# Patient Record
Sex: Female | Born: 1980 | Race: Black or African American | Hispanic: No | Marital: Married | Smoking: Never smoker
Health system: Southern US, Community
[De-identification: ages and names within clinical notes are randomized; demographics above are authoritative.]

## PROBLEM LIST (undated history)

## (undated) DIAGNOSIS — A6000 Herpesviral infection of urogenital system, unspecified: Secondary | ICD-10-CM

## (undated) DIAGNOSIS — D219 Benign neoplasm of connective and other soft tissue, unspecified: Secondary | ICD-10-CM

## (undated) DIAGNOSIS — O1495 Unspecified pre-eclampsia, complicating the puerperium: Secondary | ICD-10-CM

## (undated) DIAGNOSIS — O24419 Gestational diabetes mellitus in pregnancy, unspecified control: Secondary | ICD-10-CM

## (undated) HISTORY — DX: Gestational diabetes mellitus in pregnancy, unspecified control: O24.419

## (undated) HISTORY — DX: Herpesviral infection of urogenital system, unspecified: A60.00

## (undated) HISTORY — DX: Unspecified pre-eclampsia, complicating the puerperium: O14.95

## (undated) HISTORY — PX: NO PAST SURGERIES: SHX2092

## (undated) HISTORY — PX: MOUTH SURGERY: SHX715

## (undated) HISTORY — DX: Benign neoplasm of connective and other soft tissue, unspecified: D21.9

---

## 2009-04-20 ENCOUNTER — Encounter: Payer: Self-pay | Admitting: Infectious Diseases

## 2009-07-08 DIAGNOSIS — N764 Abscess of vulva: Secondary | ICD-10-CM

## 2009-07-14 ENCOUNTER — Encounter (INDEPENDENT_AMBULATORY_CARE_PROVIDER_SITE_OTHER): Payer: Self-pay | Admitting: *Deleted

## 2009-08-01 ENCOUNTER — Ambulatory Visit: Payer: Self-pay | Admitting: Infectious Diseases

## 2010-03-21 NOTE — Miscellaneous (Signed)
Summary: HIPAA Restrictions  HIPAA Restrictions   Imported By: Florinda Marker 08/03/2009 10:57:48  _____________________________________________________________________  External Attachment:    Type:   Image     Comment:   External Document

## 2010-03-21 NOTE — Miscellaneous (Signed)
Summary: Problems and Allergies updated  Clinical Lists Changes  Problems: Added new problem of VULVAR ABSCESS (ICD-616.4) Added new problem of MRSA (EAV-409.81) Observations: Added new observation of ALLERGY REV: Done (07/14/2009 16:04) Added new observation of NKA: T (07/14/2009 16:04)

## 2010-03-21 NOTE — Assessment & Plan Note (Signed)
Summary: NEW PT HX MRSA/VULVAR ABSCESS/REC ABS   CC:  new patient / hx mrsa/vulvar abscess/rec abs.  History of Present Illness: 30 yo F with hx of bumps under her arms. She then develope lesion in her vaginal area. Was found to have vulvar abscess (drained)- has had x3 total now. Has been given antibiotics x3 (keflx, bactrim, clinda). At this point is asx and has no further lesions.  no fevers or chills, works out at gym, no nasal sores.   Preventive Screening-Counseling & Management  Alcohol-Tobacco     Alcohol drinks/day: occassionally     Alcohol type: wine     Smoking Status: never  Caffeine-Diet-Exercise     Caffeine use/day: tea and sodas     Does Patient Exercise: yes     Type of exercise: gym membership     Exercise (avg: min/session): 30-60     Times/week: 3  Safety-Violence-Falls     Seat Belt Use: yes   Updated Prior Medication List: ORTHO TRI-CYCLEN (28) 0.18/0.215/0.25 MG-35 MCG TABS (NORGESTIM-ETH ESTRAD TRIPHASIC) One tablet by mouth daily.  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) HSV Negative HIV tests 2008/2009  Family History: Family History Hypertension  Social History: Never Smoked Alcohol use-yes - occas wine cohabitating with boyfriend  Review of Systems       nl apetite, nl BM, nl urination, boyfriend has also had lesions  Vital Signs:  Patient profile:   30 year old female Height:      62 inches (157.48 cm) Weight:      145.0 pounds (65.91 kg) BMI:     26.62 Temp:     98.1 degrees F (36.72 degrees C) oral Pulse rate:   73 / minute BP sitting:   121 / 83  (right arm)  Vitals Entered By: Baxter Hire) (August 01, 2009 2:12 PM) CC: new patient / hx mrsa/vulvar abscess/rec abs Pain Assessment Patient in pain? no      Nutritional Status BMI of 25 - 29 = overweight Nutritional Status Detail appetite is  normal per patient  Does patient need assistance? Functional Status Self care Ambulation Normal   Physical Exam  General:  well-developed, well-nourished, and well-hydrated.   Eyes:  pupils equal, pupils round, and pupils reactive to light.   Nose:  mucosal edema.   Mouth:  pharynx pink and moist and no exudates.   Neck:  no masses.   Lungs:  normal respiratory effort and normal breath sounds.   Heart:  normal rate, regular rhythm, and no murmur.   Abdomen:  soft, non-tender, and normal bowel sounds.     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 She is going to get an antibacterial soap. will try to decolonize her with Mupirocin. She will call clinic if she has other outbreaks. return to clinic as needed   Orders: Consultation Level IV (47829)  Medications Added to Medication List This Visit: 1)  Ortho Tri-cyclen (28) 0.18/0.215/0.25 Mg-35 Mcg Tabs (Norgestim-eth estrad triphasic) .... One tablet by mouth daily. 2)  Mupirocin 2 % Oint (Mupirocin) .... Apply intrnasally two times a day for first 5 days of the month for next 3 months  Patient Instructions: 1)  1) always wear clean clothes 2)  2) don't share towels or linens 3)  3) nasal decolonization 4)  4) wash clothes in hot water 5)  5) antibacterial soap 6)  6) antibacterials as needed 7)  7) good handwashing 8)  8) keep wounds covered  Prescriptions: MUPIROCIN 2 % OINT (MUPIROCIN) apply intrnasally two times a day for first 5 days of the month for next 3 months  #20g x 1   Entered and Authorized by:   Johny Sax MD   Signed by:   Johny Sax MD on 08/01/2009   Method used:   Print then Give to Patient   RxID:   [id number]   Appended Document: NEW PT HX MRSA/VULVAR ABSCESS/REC ABS

## 2010-05-30 ENCOUNTER — Encounter: Payer: Self-pay | Admitting: *Deleted

## 2011-09-11 ENCOUNTER — Emergency Department (INDEPENDENT_AMBULATORY_CARE_PROVIDER_SITE_OTHER): Payer: 59

## 2011-09-11 ENCOUNTER — Encounter (HOSPITAL_COMMUNITY): Payer: Self-pay

## 2011-09-11 ENCOUNTER — Emergency Department (HOSPITAL_COMMUNITY)
Admission: EM | Admit: 2011-09-11 | Discharge: 2011-09-11 | Disposition: A | Discharge status: Home / Self Care | Payer: 59 | Source: Home / Self Care

## 2011-09-11 DIAGNOSIS — M25512 Pain in left shoulder: Secondary | ICD-10-CM

## 2011-09-11 DIAGNOSIS — M25519 Pain in unspecified shoulder: Secondary | ICD-10-CM

## 2011-09-11 MED ORDER — TRAMADOL HCL 50 MG PO TABS: 50.0000 mg | ORAL_TABLET | Freq: Four times a day (QID) | ORAL | Status: AC | PRN

## 2011-09-11 MED ORDER — IBUPROFEN 800 MG PO TABS
ORAL_TABLET | ORAL | Status: AC
  Filled 2011-09-11: qty 1

## 2011-09-11 MED ORDER — IBUPROFEN 800 MG PO TABS
800.0000 mg | ORAL_TABLET | Freq: Once | ORAL | Status: AC
  Administered 2011-09-11: 800 mg via ORAL

## 2011-09-11 MED ORDER — CYCLOBENZAPRINE HCL 10 MG PO TABS: 10.0000 mg | ORAL_TABLET | Freq: Two times a day (BID) | ORAL | Status: AC | PRN

## 2011-09-11 MED ORDER — IBUPROFEN 800 MG PO TABS: 800.0000 mg | ORAL_TABLET | Freq: Three times a day (TID) | ORAL | Status: AC
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
## 2011-09-12 NOTE — ED Provider Notes (Signed)
Medical screening examination/treatment/procedure(s) were performed by non-physician practitioner and as supervising physician I was immediately available for consultation/collaboration.  Leslee Home, M.D.   Reuben Likes, MD 09/12/11 2059

## 2012-08-17 ENCOUNTER — Ambulatory Visit (INDEPENDENT_AMBULATORY_CARE_PROVIDER_SITE_OTHER): Payer: BC Managed Care – PPO | Admitting: Family Medicine

## 2012-08-17 VITALS — BP 110/73 | HR 76 | Temp 97.9°F | Resp 16 | Ht 62.5 in | Wt 150.0 lb

## 2012-08-17 DIAGNOSIS — T7840XA Allergy, unspecified, initial encounter: Secondary | ICD-10-CM

## 2012-08-17 DIAGNOSIS — W57XXXA Bitten or stung by nonvenomous insect and other nonvenomous arthropods, initial encounter: Secondary | ICD-10-CM

## 2012-08-17 MED ORDER — PREDNISONE 20 MG PO TABS: ORAL_TABLET | ORAL | Status: DC

## 2012-08-17 NOTE — Progress Notes (Signed)
[address]   [place], [place]  [address]   [phone]  Subjective:    Patient ID: Brooke Dickerson, female    DOB: 1980-04-06, 32 y.o.   MRN: [id number]  HPI This 32 y.o. female presents for evaluation of insect bites forearm.  Noticed two areas of bites on L arm; +redness; +swollen.  Applied ice and gold bond.  Still sore.  Swelling has worsened.  Itches a lot.  Soreness.  No pain but sore.  Took Ibuprofen.  No fever/chills/sweats.  No malaise or fatigue.  Has been outside.  No facial swelling, SOB, tongue swelling, throat swelling.  PCP:  None GYN:  [facility]   Review of Systems  Constitutional: Negative for fever, chills, diaphoresis and fatigue.  HENT: Negative for facial swelling and trouble swallowing.   Respiratory: Negative for shortness of breath, wheezing and stridor.   Skin: Positive for color change and rash. Negative for wound.    History reviewed. No pertinent past medical history.  History reviewed. No pertinent past surgical history.  Prior to Admission medications   Medication Sig Start Date End Date Taking? Authorizing Provider  Norethin Ace-Eth Estrad-FE (MINASTRIN 24 FE PO) Take by mouth.   Yes Historical Provider, MD  mupirocin (BACTROBAN) 2 % nasal ointment by Nasal route as directed. Apply intranasally two times a day for first 5 days of the month for next 3 months     Historical Provider, MD  Norgestim-Eth Estrad Triphasic (ORTHO TRI-CYCLEN, 28,) 0.18/0.215/0.25 MG-35 MCG TABS Take 1 tablet by mouth daily.      Historical Provider, MD  predniSONE (DELTASONE) 20 MG tablet Three tablets daily x 1 day then two tablets daily x 3 days 08/17/12   Ethelda Chick, MD    No Known Allergies  History   Social History  . Marital Status: Single    Spouse Name: N/A    Number of Children: N/A  . Years of Education: N/A   Occupational History  . Not on file.   Social History Main Topics  . Smoking status: Never Smoker   . Smokeless tobacco: Not on file  .  Alcohol Use: Yes     Comment: 1 a week  . Drug Use: No  . Sexually Active: Not on file   Other Topics Concern  . Not on file   Social History Narrative  . No narrative on file    Family History  Problem Relation Age of Onset  . Heart failure Mother   . Hypertension Mother   . Cancer Father        Objective:   Physical Exam  Nursing note and vitals reviewed. Constitutional: She is oriented to person, place, and time. She appears well-developed and well-nourished. No distress.  Neck: Neck supple.  Cardiovascular: Normal rate, regular rhythm and normal heart sounds.   No murmur heard. Pulmonary/Chest: Effort normal and breath sounds normal. She has no wheezes. She has no rales.  Neurological: She is alert and oriented to person, place, and time.  Skin: Rash noted. She is not diaphoretic.  LUE:  +two discrete wounds/bites with clustered vesicles small surrounding area of bite.  Distal wound with 6 cm x 4 cm of warmth, erythema, induration; no fluctuants.  Proximal wound with 4 cm x 3 cm area of induration, swelling, warmth.  Psychiatric: She has a normal mood and affect. Her behavior is normal.       Assessment & Plan:  Insect bite  Allergic reaction, initial encounter - Plan: predniSONE (  DELTASONE) 20 MG tablet   1. Insect Bites LUE:  New.  With localized allergic reaction. Keep areas clean and dry.  Supportive care.  Tetanus UTD. 2.  Allergic Reaction to insect bites:  New.  Rx for Prednisone provided.   Recommend Zyrtec 10mg  daily, Benadryl 25mg  qhs, Zantac 150mg  bid.  Ice and elevate.  RTC for increasing swelling, pain, redness.  RTC for fever> 100.5.    Meds ordered this encounter  Medications  . Norethin Ace-Eth Estrad-FE (MINASTRIN 24 FE PO)    Sig: Take by mouth.  . predniSONE (DELTASONE) 20 MG tablet    Sig: Three tablets daily x 1 day then two tablets daily x 3 days    Dispense:  9 tablet    Refill:  0

## 2012-08-17 NOTE — Patient Instructions (Addendum)
1.  Take Zyrtec 10mg  one tablet daily. 2. Take Benadryl 25 mg one tablet at bedtime. 3.  Take Zantac 150mg  one tablet twice daily. 4.  Take Prednisone as prescribed. 5.  Keep area clean and dry. 6.  Return for development of fever>100.5, increasing pain, or increasing swelling. 7.  Continue ice to area for 15 minutes twice daily.

## 2012-09-28 ENCOUNTER — Ambulatory Visit (INDEPENDENT_AMBULATORY_CARE_PROVIDER_SITE_OTHER): Payer: BC Managed Care – PPO | Admitting: Family Medicine

## 2012-09-28 VITALS — BP 122/88 | HR 108 | Temp 98.9°F | Resp 18 | Ht 63.0 in | Wt 156.0 lb

## 2012-09-28 DIAGNOSIS — N764 Abscess of vulva: Secondary | ICD-10-CM

## 2012-09-28 DIAGNOSIS — A4902 Methicillin resistant Staphylococcus aureus infection, unspecified site: Secondary | ICD-10-CM

## 2012-09-28 DIAGNOSIS — N751 Abscess of Bartholin's gland: Secondary | ICD-10-CM

## 2012-09-28 MED ORDER — DOXYCYCLINE HYCLATE 100 MG PO CAPS: 100.0000 mg | ORAL_CAPSULE | Freq: Two times a day (BID) | ORAL | Status: DC

## 2012-09-28 MED ORDER — OXYCODONE-ACETAMINOPHEN 5-325 MG PO TABS: 1.0000 | ORAL_TABLET | ORAL | Status: DC | PRN

## 2012-09-28 NOTE — Patient Instructions (Addendum)
We drained about 20-25 mL of pus out of the cyst today using a needle. We did not incise or explore the cyst which may still be needed so make sure you continue on your doxycycline and follow-up with you gynecologist in 1-2d for definitive management. If things get worse in the meantime, go to women's ER.   Bartholin's Cyst or Abscess Bartholin's glands are small glands located within the folds of skin (labia) along the sides of the lower opening of the vagina (birth canal). A cyst may develop when the duct of the gland becomes blocked. When this happens, fluid that accumulates within the cyst can become infected. This is known as an abscess. The Bartholin gland produces a mucous fluid to lubricate the outside of the vagina during sexual intercourse. SYMPTOMS   Patients with a small cyst may not have any symptoms.  Mild discomfort to severe pain depending on the size of the cyst and if it is infected (abscess).  Pain, redness, and swelling around the lower opening of the vagina.  Painful intercourse.  Pressure in the perineal area.  Swelling of the lips of the vagina (labia).  The cyst or abscess can be on one side or both sides of the vagina. DIAGNOSIS   A large swelling is seen in the lower vagina area by your caregiver.  Painful to touch.  Redness and pain, if it is an abscess. TREATMENT   Sometimes the cyst will go away on its own.  Apply warm wet compresses to the area or take hot sitz baths several times a day.  An incision to drain the cyst or abscess with local anesthesia.  Culture the pus, if it is an abscess.  Antibiotic treatment, if it is an abscess.  Cut open the gland and suture the edges to make the opening of the gland bigger (marsupialization).  Remove the whole gland if the cyst or abscess returns. PREVENTION   Practice good hygiene.  Clean the vaginal area with a mild soap and soft cloth when bathing.  Do not rub hard in the vaginal area when  bathing.  Protect the crotch area with a padded cushion if you take long bike rides or ride horses.  Be sure you are well lubricated when you have sexual intercourse. HOME CARE INSTRUCTIONS   If your cyst or abscess was opened, a small piece of gauze, or a drain, may have been placed in the wound to allow drainage. Do not remove this gauze or drain unless directed by your caregiver.  Wear feminine pads, not tampons, as needed for any drainage or bleeding.  If antibiotics were prescribed, take them exactly as directed. Finish the entire course.  Only take over-the-counter or prescription medicines for pain, discomfort, or fever as directed by your caregiver. SEEK IMMEDIATE MEDICAL CARE IF:   You have an increase in pain, redness, swelling, or drainage.  You have bleeding from the wound which results in the use of more than the number of pads suggested by your caregiver in 24 hours.  You have chills.  You have a fever.  You develop any new problems (symptoms) or aggravation of your existing condition. MAKE SURE YOU:   Understand these instructions.  Will watch your condition.  Will get help right away if you are not doing well or get worse. Document Released: 02/05/2005 Document Revised: 04/30/2011 Document Reviewed: 09/24/2007 [facility] Patient Information 2014 [place], [place].

## 2012-09-28 NOTE — Progress Notes (Deleted)
  Subjective:    Patient ID: Brooke Dickerson, female    DOB: Jun 30, 1980, 32 y.o.   MRN: [id number] Chief Complaint  Patient presents with  . Cyst    left side of the Labia    HPI  Has had boil on labia - Bartholin's cyst - goes to [facility] in [place] - saw her dr.'s partner who didn't drain it - just put her on doxycycline bid on Thursday and has been taking it since then. Not draining on it's own but getting bigger and more painful.   Has something similar several yrs ago - occ flairs but not painful - told this was normal but has never been normal.      Review of Systems     BP 122/88  Pulse 108  Temp(Src) 98.9 F (37.2 C) (Oral)  Resp 18  Ht 5\' 3"  (1.6 m)  Wt 156 lb (70.761 kg)  BMI 27.64 kg/m2  SpO2 99%  LMP 09/13/2012 Objective:   Physical Exam        Assessment & Plan:

## 2012-09-29 NOTE — Progress Notes (Signed)
Subjective:    Patient ID: Brooke Dickerson, female    DOB: 10/19/1980, 32 y.o.   MRN: [id number] Chief Complaint  Patient presents with  . Cyst    left side of the Labia   HPI  Has had boil on labia - Bartholin's cyst for several years - goes to [facility] in [place]. First developed several years ago, was very painful and was drained using a catheter by her gynecologist. Since then it has occasionally grown larger then shrunk down but never painful so no additional intervention needed until it began to grow again 1 wk prev but this time became very painful. Saw her dr.'s partner 4d ago who didn't drain it but put her on doxycycline bid which she has been taking since then. Was told to go to UC if it did not improve for draining and over the past 4d has been getting progressively worse - Not draining spontaneously and getting bigger and more painful.   Hydrocodone gives her some relief for just a few hours.  History reviewed. No pertinent past medical history. No current outpatient prescriptions on file prior to visit.   No current facility-administered medications on file prior to visit.   No Known Allergies   Review of Systems Gen: No f/c Abd: No n/v, no c/d Gyn: No vag d/c, +genital pain and sore, no urine sxs     BP 122/88  Pulse 108  Temp(Src) 98.9 F (37.2 C) (Oral)  Resp 18  Ht 5\' 3"  (1.6 m)  Wt 156 lb (70.761 kg)  BMI 27.64 kg/m2  SpO2 99%  LMP 09/13/2012 Objective:   Physical Exam  Constitutional: She is oriented to person, place, and time. She appears well-developed and well-nourished. No distress.  HENT:  Head: Normocephalic and atraumatic.  Right Ear: External ear normal.  Eyes: Conjunctivae are normal. No scleral icterus.  Pulmonary/Chest: Effort normal.  Neurological: She is alert and oriented to person, place, and time.  Skin: Skin is warm and dry. She is not diaphoretic. No erythema.  Psychiatric: She has a normal mood and affect. Her behavior is  normal.  GU: Left lower labia with 3in dm abscess - very tender and fluctuant, no sig erythema or warmth, extending up to 4-5 oclock area of vaginal introitus.    Verbal consent obtained. Left labia cleaned with betadine x 3. Anesthesia to lower inner left labia of 0.5cc subcu 2% plain lidocaine.  18g needle introduced into abscess at left inner lower labia and withdrew approx 15cc of grossly purulent fluid. An additional 5-10cc purulence drained spontaneously through needle tract upon palp of abscess.  Pt tolerated procedure well, no complications.  Assessment & Plan:  VULVAR ABSCESS - Plan: Wound culture  MRSA  Bartholin's gland abscess - on day 5 of doxy - continue for another 10d. Pt offered to go to [facility] ER for more definitive management but she declined so partially drained today for pt comfort w/ sig relief. F/u w/ gynecologist in 1-2d for recheck and further intervention as necessary.  Freq warm compresses and sitz baths.  Meds ordered this encounter  Medications  . DISCONTD: HYDROcodone-acetaminophen (NORCO/VICODIN) 5-325 MG per tablet    Sig: Take 1 tablet by mouth every 6 (six) hours as needed for pain.  Marland Kitchen DISCONTD: doxycycline (VIBRAMYCIN) 100 MG capsule    Sig: Take 100 mg by mouth 2 (two) times daily.  Marland Kitchen doxycycline (VIBRAMYCIN) 100 MG capsule    Sig: Take 1 capsule (100 mg total) by mouth 2 (two) times  daily.    Dispense:  20 capsule    Refill:  0  . oxyCODONE-acetaminophen (ROXICET) 5-325 MG per tablet    Sig: Take 1 tablet by mouth every 4 (four) hours as needed for pain.    Dispense:  20 tablet    Refill:  0

## 2012-10-01 LAB — WOUND CULTURE: Gram Stain: NONE SEEN

## 2013-09-12 ENCOUNTER — Ambulatory Visit (INDEPENDENT_AMBULATORY_CARE_PROVIDER_SITE_OTHER): Payer: 59 | Admitting: Family Medicine

## 2013-09-12 VITALS — BP 122/78 | HR 91 | Temp 98.5°F | Resp 18 | Ht 63.0 in | Wt 155.2 lb

## 2013-09-12 DIAGNOSIS — B373 Candidiasis of vulva and vagina: Secondary | ICD-10-CM

## 2013-09-12 DIAGNOSIS — N898 Other specified noninflammatory disorders of vagina: Secondary | ICD-10-CM

## 2013-09-12 DIAGNOSIS — N949 Unspecified condition associated with female genital organs and menstrual cycle: Secondary | ICD-10-CM

## 2013-09-12 DIAGNOSIS — N764 Abscess of vulva: Secondary | ICD-10-CM

## 2013-09-12 DIAGNOSIS — R102 Pelvic and perineal pain: Secondary | ICD-10-CM

## 2013-09-12 DIAGNOSIS — B3731 Acute candidiasis of vulva and vagina: Secondary | ICD-10-CM

## 2013-09-12 LAB — POCT WET PREP WITH KOH
KOH Prep POC: NEGATIVE
RBC WET PREP PER HPF POC: NEGATIVE
Trichomonas, UA: NEGATIVE
Yeast Wet Prep HPF POC: NEGATIVE

## 2013-09-12 MED ORDER — SULFAMETHOXAZOLE-TRIMETHOPRIM 400-80 MG PO TABS: 1.0000 | ORAL_TABLET | ORAL | Status: DC

## 2013-09-12 MED ORDER — FLUCONAZOLE 150 MG PO TABS: 150.0000 mg | ORAL_TABLET | Freq: Once | ORAL | Status: DC

## 2013-09-12 MED ORDER — IBUPROFEN 800 MG PO TABS: 800.0000 mg | ORAL_TABLET | Freq: Three times a day (TID) | ORAL | Status: DC | PRN

## 2013-09-12 MED ORDER — HYDROCODONE-ACETAMINOPHEN 5-325 MG PO TABS: 1.0000 | ORAL_TABLET | Freq: Four times a day (QID) | ORAL | Status: DC | PRN

## 2013-09-12 NOTE — Patient Instructions (Signed)
Continue the antibiotic as prescribed. Apply a warm compress to the area for 15-20 minutes 2-4 times each day. Change the dressing if it becomes saturated, leaks or falls off.  

## 2013-09-12 NOTE — Progress Notes (Signed)
Subjective:    Patient ID: Brooke Dickerson, female    DOB: [birth year], 33 y.o.   MRN: [id number]  09/12/2013  Abscess   HPI This 33 y.o. female presents for evaluation of vaginal pain/infection.  Onset six days ago.  Pain and swelling.  Similar issue last year; presented to OB/GYN three days ago; prescribed abx/Minocycline; recommended sitz baths; taking Ibuprofen 800mg  every four hours.  No increase in size but no improvement.  Starts new job on Monday; works for [employer]; inpatient unit.  No fever/chills/sweats since the day prior to evaluation by OB/GYN.  No drainage.  Pain is no better since onset.  Sexually active; same partner x 6 years.  STD/herpes genital six years ago.  Also has been suffering with vaginal discharge; treated self with Monistat-7 with persistent discharge.  Trying to get pregnant; taking PNV.   Review of Systems  Constitutional: Negative for fever, chills, diaphoresis and fatigue.  Gastrointestinal: Negative for nausea, vomiting and abdominal pain.  Genitourinary: Positive for vaginal discharge and vaginal pain. Negative for dysuria, frequency, hematuria, flank pain, vaginal bleeding, genital sores and pelvic pain.    Past Medical History  Diagnosis Date  . Genital herpes    History reviewed. No pertinent past surgical history. No Known Allergies Current Outpatient Prescriptions  Medication Sig Dispense Refill  . cephALEXin (KEFLEX) 500 MG capsule Take 500 mg by mouth 3 (three) times daily.      . fluconazole (DIFLUCAN) 150 MG tablet Take 1 tablet (150 mg total) by mouth once. Repeat if needed  2 tablet  0  . HYDROcodone-acetaminophen (NORCO/VICODIN) 5-325 MG per tablet Take 1 tablet by mouth every 6 (six) hours as needed for moderate pain.  30 tablet  0  . ibuprofen (ADVIL,MOTRIN) 800 MG tablet Take 1 tablet (800 mg total) by mouth every 8 (eight) hours as needed.  30 tablet  0  . sulfamethoxazole-trimethoprim (BACTRIM,SEPTRA) 400-80 MG per  tablet Take 1 tablet by mouth 3 (three) times a week.  30 tablet  0   No current facility-administered medications for this visit.       Objective:    BP 122/78  Pulse 91  Temp(Src) 98.5 F (36.9 C) (Oral)  Resp 18  Ht 5\' 3"  (1.6 m)  Wt 155 lb 3.2 oz (70.398 kg)  BMI 27.50 kg/m2  SpO2 100%  LMP 08/24/2013 Physical Exam  Nursing note and vitals reviewed. Constitutional: She is oriented to person, place, and time. She appears well-developed and well-nourished. No distress.  HENT:  Head: Normocephalic and atraumatic.  Eyes: Conjunctivae are normal. Pupils are equal, round, and reactive to light.  Neck: Normal range of motion. Neck supple.  Cardiovascular: Normal rate, regular rhythm and normal heart sounds.  Exam reveals no gallop and no friction rub.   No murmur heard. Pulmonary/Chest: Effort normal and breath sounds normal. She has no wheezes. She has no rales.  Abdominal: Soft. Bowel sounds are normal. She exhibits no distension and no mass. There is no tenderness. There is no rebound and no guarding.  Genitourinary: Uterus normal.    No labial fusion. There is no rash, tenderness or lesion on the right labia. There is tenderness on the left labia. There is no rash, lesion or injury on the left labia. Cervix exhibits discharge. Cervix exhibits no motion tenderness and no friability. Right adnexum displays no mass, no tenderness and no fullness. No erythema, tenderness or bleeding around the vagina. No foreign body around the vagina. Vaginal discharge found.  Large fluctuant swelling with tenderness L inferior labia majora the size of golfball; no erythema or vesicles or pustules.  Neurological: She is alert and oriented to person, place, and time.  Skin: Skin is warm and dry. No rash noted. She is not diaphoretic.  Psychiatric: She has a normal mood and affect. Her behavior is normal.   Results for orders placed in visit on 09/12/13  POCT WET PREP WITH KOH      Result Value  Ref Range   Trichomonas, UA Negative     Clue Cells Wet Prep HPF POC 25%     Epithelial Wet Prep HPF POC 2-12     Yeast Wet Prep HPF POC neg     Bacteria Wet Prep HPF POC 2+     RBC Wet Prep HPF POC neg     WBC Wet Prep HPF POC 1-7     KOH Prep POC Negative     PROCEDURE NOTE: SEE SEPARATE PROCEDURE NOTE.    Assessment & Plan:   1. Vaginal discharge   2. Labial abscess   3. Vaginal pain   4. Candidiasis of vulva and vagina    1. Vaginal discharge: New.  S/p Monistat-7.  Clinically consistent with candidiasis; treat with Diflucan now and upon completion of antibiotics. Call if no improvement with Diflucan. 2.  Labial L pain; New. Secondary to abscess. Rx for Ibuprofen 800mg  tid provided; rx for hydrocodone also provided. 3.  L labial abscess:  New; s/p I&D with packing placement; continue Keflex; add Bactrim while awaiting wound culture; send wound culture.  RTC 48 hours for packing removal and reassessment. 4.  Candidiasis vulvovaginal:  New. Treat with Diflucan.  Meds ordered this encounter  Medications  . cephALEXin (KEFLEX) 500 MG capsule    Sig: Take 500 mg by mouth 3 (three) times daily.  Marland Kitchen DISCONTD: ibuprofen (ADVIL,MOTRIN) 800 MG tablet    Sig: Take 800 mg by mouth every 6 (six) hours as needed for moderate pain.  Marland Kitchen ibuprofen (ADVIL,MOTRIN) 800 MG tablet    Sig: Take 1 tablet (800 mg total) by mouth every 8 (eight) hours as needed.    Dispense:  30 tablet    Refill:  0  . HYDROcodone-acetaminophen (NORCO/VICODIN) 5-325 MG per tablet    Sig: Take 1 tablet by mouth every 6 (six) hours as needed for moderate pain.    Dispense:  30 tablet    Refill:  0  . sulfamethoxazole-trimethoprim (BACTRIM,SEPTRA) 400-80 MG per tablet    Sig: Take 1 tablet by mouth 3 (three) times a week.    Dispense:  30 tablet    Refill:  0  . fluconazole (DIFLUCAN) 150 MG tablet    Sig: Take 1 tablet (150 mg total) by mouth once. Repeat if needed    Dispense:  2 tablet    Refill:  0    Return  in about 2 days (around 09/14/2013) for wound care in 1-2 days.    Reginia Forts, M.D.  [facility] [address] [place], [place]  [address] [phone] phone [phone] fax

## 2013-09-12 NOTE — Progress Notes (Signed)
PROCEDURE: Verbal Consent Obtained. Local anesthesia with 2 cc of 2% lidocaine plain.  11 blade used to incise the lesion centrally.  Copious purulence expressed. Irrigated wound with 3 cc of 2% lidocaine and packed with 1/4 inch plain packing.  Cleansed and dressed.

## 2013-09-14 ENCOUNTER — Ambulatory Visit (INDEPENDENT_AMBULATORY_CARE_PROVIDER_SITE_OTHER): Payer: 59 | Admitting: Physician Assistant

## 2013-09-14 VITALS — BP 120/80 | HR 84 | Temp 99.1°F | Resp 16 | Ht 62.25 in | Wt 156.0 lb

## 2013-09-14 DIAGNOSIS — N764 Abscess of vulva: Secondary | ICD-10-CM

## 2013-09-14 LAB — WOUND CULTURE
GRAM STAIN: NONE SEEN
ORGANISM ID, BACTERIA: NORMAL

## 2013-09-14 MED ORDER — SULFAMETHOXAZOLE-TMP DS 800-160 MG PO TABS: 1.0000 | ORAL_TABLET | Freq: Two times a day (BID) | ORAL | Status: DC

## 2013-09-15 NOTE — Progress Notes (Signed)
Patient ID: Brooke Dickerson MRN: [id number], DOB: 1980/06/01 33 y.o. Date of Encounter: 09/15/2013, 9:49 AM  Chief Complaint: Wound care   See previous note  HPI: 33 y.o. y/o female presents for wound care s/p I&D on 09/12/13.  Doing well No issues or complaints Afebrile/ no chills No nausea or vomiting Tolerating Septra and Keflex.  Is concerned because the Septra rx she had filled said 1 tablet 3 times a week She has been taking it twice daily.  Pain improved significantly.  Reports packing fell out yesterday. Daily dressing change Previous note reviewed  Past Medical History  Diagnosis Date  . Genital herpes      Home Meds: Prior to Admission medications   Medication Sig Start Date End Date Taking? Authorizing Provider  cephALEXin (KEFLEX) 500 MG capsule Take 500 mg by mouth 3 (three) times daily.   Yes Historical Provider, MD  fluconazole (DIFLUCAN) 150 MG tablet Take 1 tablet (150 mg total) by mouth once. Repeat if needed 09/12/13  Yes Wardell Honour, MD  HYDROcodone-acetaminophen (NORCO/VICODIN) 5-325 MG per tablet Take 1 tablet by mouth every 6 (six) hours as needed for moderate pain. 09/12/13  Yes Wardell Honour, MD  ibuprofen (ADVIL,MOTRIN) 800 MG tablet Take 1 tablet (800 mg total) by mouth every 8 (eight) hours as needed. 09/12/13  Yes Wardell Honour, MD  sulfamethoxazole-trimethoprim (BACTRIM,SEPTRA) 400-80 MG per tablet Take 1 tablet by mouth 3 (three) times a week. 09/12/13  Yes Wardell Honour, MD  sulfamethoxazole-trimethoprim (BACTRIM DS) 800-160 MG per tablet Take 1 tablet by mouth 2 (two) times daily. 09/14/13   Collene Leyden, PA-C    Allergies: No Known Allergies  ROS: Constitutional: Afebrile, no chills Cardiovascular: negative for chest pain or palpitations Dermatological: Positive for wound. Negative for erythema, pain, or warmth.  GI: No nausea or vomiting   EXAM: Physical Exam: Blood pressure 120/80, pulse 84, temperature 99.1 F (37.3 C),  temperature source Oral, resp. rate 16, height 5' 2.25" (1.581 m), weight 156 lb (70.761 kg), last menstrual period 08/24/2013, SpO2 100.00%., Body mass index is 28.31 kg/(m^2). General: Well developed, well nourished, in no acute distress. Nontoxic appearing. Head: Normocephalic, atraumatic, sclera non-icteric.  Neck: Supple. Lungs: Breathing is unlabored. Heart: Normal rate. Skin:  Warm and moist. Dressing and packing in place. No induration, erythema, or tenderness to palpation. Neuro: Alert and oriented X 3. Moves all extremities spontaneously. Normal gait.  Psych:  Responds to questions appropriately with a normal affect.       PROCEDURE: Dressing removed. No purulence expressed Wound bed healthy Not repacked.  Dressing applied  LAB: Culture: no significant growth.   A/P: 33 y.o. y/o female with labial cellulitis/abscess as above s/p I&D on Wound care per above Continue Keflex. Re-wrote rx for Septra DS twice daily x 7 days Pain well controlled Daily dressing changes Recheck as needed.   Angela Nevin, PA-C 09/15/2013 9:49 AM

## 2013-10-05 ENCOUNTER — Other Ambulatory Visit: Payer: Self-pay | Admitting: Physician Assistant

## 2013-12-30 LAB — OB RESULTS CONSOLE GC/CHLAMYDIA
Chlamydia: NEGATIVE
GC PROBE AMP, GENITAL: NEGATIVE

## 2013-12-30 LAB — OB RESULTS CONSOLE RUBELLA ANTIBODY, IGM: Rubella: IMMUNE

## 2013-12-30 LAB — OB RESULTS CONSOLE HEPATITIS B SURFACE ANTIGEN: HEP B S AG: NEGATIVE

## 2013-12-30 LAB — OB RESULTS CONSOLE HIV ANTIBODY (ROUTINE TESTING): HIV: NONREACTIVE

## 2013-12-30 LAB — OB RESULTS CONSOLE RPR: RPR: NONREACTIVE

## 2014-02-19 NOTE — L&D Delivery Note (Signed)
Delivery Note At 5:15 AM a viable female "Brooke Dickerson" was delivered via Vaginal, Spontaneous Delivery (Presentation: Occiput Anterior restituting to LOA). APGARS: Pending. Weight: Pending.   Placenta status: Intact, Spontaneous. To pathology due to 1) Unexplained vaginal bleeding before and during labor 2) Short cord, 3) Yellow stained meconium. Cord: 3 vessels with the following complications: None.  Cord pH: NA  Anesthesia: Epidural  Episiotomy: None Lacerations: Abrasion Suture Repair: NA Est. Blood Loss (mL): Pending  Mom to postpartum.  Baby to Couplet care / Skin to Skin.  Mom plans to breastfeed.  Undecided re: birth control.  Farrel Gordon 08/21/2014, 5:44 AM

## 2014-08-20 ENCOUNTER — Inpatient Hospital Stay (HOSPITAL_COMMUNITY): Payer: PRIVATE HEALTH INSURANCE | Admitting: Anesthesiology

## 2014-08-20 ENCOUNTER — Inpatient Hospital Stay (HOSPITAL_COMMUNITY)
Admission: AD | Admit: 2014-08-20 | Discharge: 2014-08-23 | DRG: 774 | Disposition: A | Discharge status: Home / Self Care | Payer: PRIVATE HEALTH INSURANCE | Source: Ambulatory Visit | Attending: Obstetrics & Gynecology | Admitting: Obstetrics & Gynecology

## 2014-08-20 ENCOUNTER — Encounter (HOSPITAL_COMMUNITY): Payer: Self-pay

## 2014-08-20 DIAGNOSIS — O26899 Other specified pregnancy related conditions, unspecified trimester: Secondary | ICD-10-CM

## 2014-08-20 DIAGNOSIS — Z833 Family history of diabetes mellitus: Secondary | ICD-10-CM

## 2014-08-20 DIAGNOSIS — O99824 Streptococcus B carrier state complicating childbirth: Secondary | ICD-10-CM | POA: Diagnosis present

## 2014-08-20 DIAGNOSIS — Z22330 Carrier of Group B streptococcus: Secondary | ICD-10-CM

## 2014-08-20 DIAGNOSIS — Z825 Family history of asthma and other chronic lower respiratory diseases: Secondary | ICD-10-CM

## 2014-08-20 DIAGNOSIS — Z3A39 39 weeks gestation of pregnancy: Secondary | ICD-10-CM | POA: Diagnosis present

## 2014-08-20 DIAGNOSIS — Z8249 Family history of ischemic heart disease and other diseases of the circulatory system: Secondary | ICD-10-CM | POA: Diagnosis not present

## 2014-08-20 DIAGNOSIS — Z6791 Unspecified blood type, Rh negative: Secondary | ICD-10-CM | POA: Diagnosis not present

## 2014-08-20 DIAGNOSIS — N939 Abnormal uterine and vaginal bleeding, unspecified: Secondary | ICD-10-CM

## 2014-08-20 DIAGNOSIS — O26893 Other specified pregnancy related conditions, third trimester: Secondary | ICD-10-CM | POA: Diagnosis present

## 2014-08-20 DIAGNOSIS — A6000 Herpesviral infection of urogenital system, unspecified: Secondary | ICD-10-CM | POA: Diagnosis not present

## 2014-08-20 DIAGNOSIS — Z823 Family history of stroke: Secondary | ICD-10-CM | POA: Diagnosis not present

## 2014-08-20 DIAGNOSIS — O9832 Other infections with a predominantly sexual mode of transmission complicating childbirth: Secondary | ICD-10-CM | POA: Diagnosis present

## 2014-08-20 LAB — CBC
HCT: 43.7 % (ref 36.0–46.0)
Hemoglobin: 15.3 g/dL — ABNORMAL HIGH (ref 12.0–15.0)
MCH: 33.2 pg (ref 26.0–34.0)
MCHC: 35 g/dL (ref 30.0–36.0)
MCV: 94.8 fL (ref 78.0–100.0)
PLATELETS: 232 10*3/uL (ref 150–400)
RBC: 4.61 MIL/uL (ref 3.87–5.11)
RDW: 14.8 % (ref 11.5–15.5)
WBC: 11.4 10*3/uL — ABNORMAL HIGH (ref 4.0–10.5)

## 2014-08-20 LAB — OB RESULTS CONSOLE GBS: GBS: POSITIVE

## 2014-08-20 MED ORDER — OXYTOCIN 40 UNITS IN LACTATED RINGERS INFUSION - SIMPLE MED
1.0000 m[IU]/min | INTRAVENOUS | Status: DC
  Administered 2014-08-20: 1 m[IU]/min via INTRAVENOUS
  Filled 2014-08-20: qty 1000

## 2014-08-20 MED ORDER — ZOLPIDEM TARTRATE 5 MG PO TABS: 5.0000 mg | ORAL_TABLET | Freq: Every evening | ORAL | Status: DC | PRN

## 2014-08-20 MED ORDER — LACTATED RINGERS IV SOLN: 500.0000 mL | INTRAVENOUS | Status: DC | PRN

## 2014-08-20 MED ORDER — OXYCODONE-ACETAMINOPHEN 5-325 MG PO TABS: 1.0000 | ORAL_TABLET | ORAL | Status: DC | PRN

## 2014-08-20 MED ORDER — DIPHENHYDRAMINE HCL 50 MG/ML IJ SOLN: 12.5000 mg | INTRAMUSCULAR | Status: DC | PRN

## 2014-08-20 MED ORDER — TERBUTALINE SULFATE 1 MG/ML IJ SOLN: 0.2500 mg | Freq: Once | INTRAMUSCULAR | Status: AC | PRN

## 2014-08-20 MED ORDER — OXYTOCIN BOLUS FROM INFUSION
500.0000 mL | INTRAVENOUS | Status: DC
  Administered 2014-08-21: 500 mL via INTRAVENOUS

## 2014-08-20 MED ORDER — FENTANYL 2.5 MCG/ML BUPIVACAINE 1/10 % EPIDURAL INFUSION (WH - ANES)
14.0000 mL/h | INTRAMUSCULAR | Status: DC | PRN
Start: 2014-08-20 — End: 2014-08-21
  Administered 2014-08-20 – 2014-08-21 (×4): 14 mL/h via EPIDURAL
  Filled 2014-08-20 (×3): qty 125

## 2014-08-20 MED ORDER — CITRIC ACID-SODIUM CITRATE 334-500 MG/5ML PO SOLN: 30.0000 mL | ORAL | Status: DC | PRN

## 2014-08-20 MED ORDER — ACETAMINOPHEN 325 MG PO TABS: 650.0000 mg | ORAL_TABLET | ORAL | Status: DC | PRN

## 2014-08-20 MED ORDER — PENICILLIN G POTASSIUM 5000000 UNITS IJ SOLR
2.5000 10*6.[IU] | INTRAVENOUS | Status: DC
  Administered 2014-08-20 – 2014-08-21 (×4): 2.5 10*6.[IU] via INTRAVENOUS
  Filled 2014-08-20 (×8): qty 2.5

## 2014-08-20 MED ORDER — FLEET ENEMA 7-19 GM/118ML RE ENEM: 1.0000 | ENEMA | RECTAL | Status: DC | PRN

## 2014-08-20 MED ORDER — PHENYLEPHRINE 40 MCG/ML (10ML) SYRINGE FOR IV PUSH (FOR BLOOD PRESSURE SUPPORT)
80.0000 ug | PREFILLED_SYRINGE | INTRAVENOUS | Status: DC | PRN
  Filled 2014-08-20: qty 2
  Filled 2014-08-20: qty 20

## 2014-08-20 MED ORDER — ONDANSETRON HCL 4 MG/2ML IJ SOLN
4.0000 mg | Freq: Four times a day (QID) | INTRAMUSCULAR | Status: DC | PRN
  Administered 2014-08-20: 4 mg via INTRAVENOUS
  Filled 2014-08-20: qty 2

## 2014-08-20 MED ORDER — LACTATED RINGERS IV SOLN
INTRAVENOUS | Status: DC
  Administered 2014-08-20 (×2): via INTRAVENOUS

## 2014-08-20 MED ORDER — OXYCODONE-ACETAMINOPHEN 5-325 MG PO TABS: 2.0000 | ORAL_TABLET | ORAL | Status: DC | PRN

## 2014-08-20 MED ORDER — FENTANYL CITRATE (PF) 100 MCG/2ML IJ SOLN
100.0000 ug | INTRAMUSCULAR | Status: DC | PRN
  Administered 2014-08-20 (×2): 100 ug via INTRAVENOUS
  Filled 2014-08-20 (×2): qty 2

## 2014-08-20 MED ORDER — LIDOCAINE HCL (PF) 1 % IJ SOLN
INTRAMUSCULAR | Status: DC | PRN
  Administered 2014-08-20 (×2): 4 mL

## 2014-08-20 MED ORDER — OXYTOCIN 40 UNITS IN LACTATED RINGERS INFUSION - SIMPLE MED
62.5000 mL/h | INTRAVENOUS | Status: DC
Start: 2014-08-20 — End: 2014-08-21

## 2014-08-20 MED ORDER — DEXTROSE 5 % IV SOLN
5.0000 10*6.[IU] | Freq: Once | INTRAVENOUS | Status: AC
  Administered 2014-08-20: 5 10*6.[IU] via INTRAVENOUS
  Filled 2014-08-20: qty 5

## 2014-08-20 MED ORDER — EPHEDRINE 5 MG/ML INJ
10.0000 mg | INTRAVENOUS | Status: DC | PRN
  Filled 2014-08-20: qty 2

## 2014-08-20 MED ORDER — LIDOCAINE HCL (PF) 1 % IJ SOLN
30.0000 mL | INTRAMUSCULAR | Status: DC | PRN
  Filled 2014-08-20: qty 30

## 2014-08-20 NOTE — MAU Note (Signed)
Pt reports she has some bleeding after sex tonight, then started having ? Contractions and pressure.

## 2014-08-20 NOTE — Anesthesia Preprocedure Evaluation (Signed)
Anesthesia Evaluation  Patient identified by MRN, date of birth, ID band Patient awake    Reviewed: Allergy & Precautions, NPO status , Patient's Chart, lab work & pertinent test results  History of Anesthesia Complications Negative for: history of anesthetic complications  Airway Mallampati: II  TM Distance: >3 FB Neck ROM: Full    Dental no notable dental hx. (+) Dental Advisory Given   Pulmonary neg pulmonary ROS,  breath sounds clear to auscultation  Pulmonary exam normal       Cardiovascular negative cardio ROS Normal cardiovascular examRhythm:Regular Rate:Normal     Neuro/Psych negative neurological ROS  negative psych ROS   GI/Hepatic negative GI ROS, Neg liver ROS,   Endo/Other  negative endocrine ROS  Renal/GU negative Renal ROS  negative genitourinary   Musculoskeletal negative musculoskeletal ROS (+)   Abdominal   Peds negative pediatric ROS (+)  Hematology negative hematology ROS (+)   Anesthesia Other Findings   Reproductive/Obstetrics (+) Pregnancy                             Anesthesia Physical Anesthesia Plan  ASA: II  Anesthesia Plan: Epidural   Post-op Pain Management:    Induction:   Airway Management Planned:   Additional Equipment:   Intra-op Plan:   Post-operative Plan:   Informed Consent: I have reviewed the patients History and Physical, chart, labs and discussed the procedure including the risks, benefits and alternatives for the proposed anesthesia with the patient or authorized representative who has indicated his/her understanding and acceptance.   Dental advisory given  Plan Discussed with:   Anesthesia Plan Comments:         Anesthesia Quick Evaluation

## 2014-08-20 NOTE — H&P (Signed)
Brooke Dickerson is a 34 y.o. female, G2P0010 at 59 2/7 weeks, presented to MAU with significant bleeding and cramping after IC.  Monitored in MAU, with continued small/moderate bleeding noted.  Cervix initially closed, 50%, with change to 2 cm, 90%, vtx, -1/0 station after observation time.  FHR remained reactive, with contractions noted. Now to be admitted for further labor care.  Patient Active Problem List   Diagnosis Date Noted  . 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 Rh negative state in antepartum period 08/20/2014  . Vaginal bleeding 08/20/2014  . Normal labor 08/20/2014  . VULVAR ABSCESS 07/08/2009    History of present pregnancy: Patient entered care at 10 1/7 weeks.  Transferred from [facility] office in [place]. EDC of 08/25/14 was established by LMP.   Anatomy scan:  19 2/7 weeks, with limited anatomy and an posterior placenta.   Additional [place] evaluations:   22 2/7 weeks:  Completion of anatomy except ductal arch.   Significant prenatal events:    TDAP 05/20/14, Rhophylac 06/18/14.  On Valtrex for suppression since December for treatment of outbreak, then suppression.  Had vagal episode at 36 weeks with exam, recovered without difficulty. Last evaluation:  08/13/14--BP 110/80, no exam.  OB History    Gravida Para Term Preterm AB TAB SAB Ectopic Multiple Living   2    1 1         2014--TAB at 7 weeks  History reviewed. No pertinent past medical history. Past Surgical History  Procedure Laterality Date  . No past surgeries    TAB 2014  Family History: family history includes Cancer in her father; Heart failure in her mother; Hypertension in her mother. Emphysema in mother, thyroid issues in mother, CVA in mother, diabetes in mother.  Social History:  reports that she has never smoked. She does not have any smokeless tobacco history on file. She reports that she does not drink alcohol or use illicit drugs.  Patient is African American, of the [place],  married to [employer], who is involved and supportive.  Patient is post-graduate educated, employed as Education officer, museum.   Prenatal Transfer Tool  Maternal Diabetes: No Genetic Screening: Normal 1st trimester screen and AFP Maternal Ultrasounds/Referrals: Normal Fetal Ultrasounds or other Referrals:  None Maternal Substance Abuse:  No Significant Maternal Medications:  None Significant Maternal Lab Results: Lab values include: Group B Strep positive, Rh negative  TDAP 05/20/14 Flu 10/2013  ROS:  Vaginal bleeding, +FM, contractions.  No Known Allergies   Dilation: 2 Effacement (%): 90 Station: -1 Exam by::  Windy Kalata CNM) Blood pressure 125/74, pulse 64, temperature 98.1 F (36.7 C), temperature source Oral, resp. rate 16, height 5\' 2"  (1.575 m), weight 76.204 kg (168 lb), last menstrual period 08/24/2013, SpO2 99 %.  Chest clear Heart RRR without murmur Abd gravid, NT, FH 39 cm Pelvic: Cervix now 2 cm, 90%, vtx, -1/0, BBOW, moderate vaginal bleeding noted.  No HSV lesion noted on vulval, vagina, or cervix, and no prodrome reported. Ext: WNL  FHR: Category 1 UCs:  q 4-7 min, moderate  Prenatal labs: ABO, Rh: --/--/AB NEG (07/01 0840)AB neg Antibody: PENDING (07/01 0840)Neg Rubella:   Immune RPR:   NR HBsAg:   Neg HIV:  NR  GBS: Positive (07/01 0000) Sickle cell/Hgb electrophoresis:  AA Pap:  Unknown GC:  Negative 12/30/13 and 07/30/14 Chlamydia: Negative 12/30/13 and 07/30/14 Genetic screenings:  Normal 1st trimester screen and AFP Glucola:  Elevated 1 hour at 149, normal  3 hour Other:   Hgb 14.0 at NOB, 12.4 at 28 weeks Urine culture 100K Pseudomonas [place] 12/2013, negative TOC 01/28/14 Varicella immune       Assessment/Plan: IUP at 39 2/7 weeks Early labor GBS positive Vaginal bleeding Rh negative Hx HSV, no recent/current lesions, on prophylaxis  Plan: Admit to [facility] per consult with Dr. Alesia Richards Routine [facility] orders Pain med/epidural  prn PCN G for GBS prophylaxis  Plan pitocin augmentation and AROM as appropriate. Close observation of bleeding and FHR status as labor advances.  Taliyah Watrous, [place]ICK[place], [place] 08/20/2014, 9:45 AM

## 2014-08-20 NOTE — Progress Notes (Signed)
  Subjective: Comfortable with epidural.  Objective: BP 113/65 mmHg  Pulse 99  Temp(Src) 98.5 F (36.9 C) (Oral)  Resp 20  Ht 5\' 2"  (1.575 m)  Wt 76.204 kg (168 lb)  BMI 30.72 kg/m2  SpO2 99%  LMP 08/24/2013      FHT: Category 1 UC:   irregular, every 3-8 minutes SVE:   6 cm, 80%, vtx, -1 IUPC placed easily Pitocin at 9 mu/min  Assessment:  Active labor, but likely inadequate contractions. GBS positive  Plan: Continue pitocin augmentation to establish/maintain adequacy. Position change to facilitate rotation/descent.  Will use peanut for positioning.  Donnel Saxon CNM 08/20/2014, 6:44 PM

## 2014-08-20 NOTE — Progress Notes (Addendum)
  Subjective: Assumed care of Brooke Dickerson, G2P0 @ 39.2 wks admitted for vaginal bleeding and early labor.  Denies heavy vaginal bleeding. Reports active fetus. Comfortable w/ epidural. Spouse at bedside.  Objective: BP 123/79 mmHg  Pulse 91  Temp(Src) 98.5 F (36.9 C) (Oral)  Resp 18  Ht 5\' 2"  (1.575 m)  Wt 76.204 kg (168 lb)  BMI 30.72 kg/m2  SpO2 99%  LMP 08/24/2013     Today's Vitals   08/20/14 2059 08/20/14 2129 08/20/14 2159 08/20/14 2229  BP: 139/82 151/91 128/83 123/79  Pulse: 79 80 91 91  Temp:   98.5 F (36.9 C)   TempSrc:   Oral   Resp: 20 20 18 18   Height:      Weight:      SpO2:      PainSc:        BPs:  1859: 125/90 2029: 155/104 2049: 153/81 2129: 151/91  FHT: BL 125 w/ moderate variability, +accels, no decels UC:   irregular, every 2-3 minutes SVE:   Dilation: 4 Effacement (%): 80 Station: -1 Exam by:: Irena Reichmann, CNM Pitocin at 19 mU/min Continues to leak amniotic fluid - mec stained Old blood noted to examining glove  Assessment:  Cvx edematous. Suspect OP position Some BP elevations GBS positive  Plan: Position change/peanut PreE labs Re-evaluate in 1-2 hrs Dr. Alesia Richards updated  Jimmye Norman, [facility] CNM 08/20/2014, 11:05 PM

## 2014-08-20 NOTE — Progress Notes (Signed)
  Subjective: Comfortable with epidural.  Objective: BP 124/91 mmHg  Pulse 96  Temp(Src) 97.6 F (36.4 C) (Oral)  Resp 18  Ht 5\' 2"  (1.575 m)  Wt 76.204 kg (168 lb)  BMI 30.72 kg/m2  SpO2 99%  LMP 08/24/2013      Filed Vitals:   08/20/14 1412 08/20/14 1414 08/20/14 1417 08/20/14 1419  BP:  123/80  124/91  Pulse: 101 102 91 96  Temp:      TempSrc:      Resp:      Height:      Weight:      SpO2:        FHT: Category 1 UC:   regular, every 6-8 minutes SVE:   Dilation: 5 Effacement (%): 90 Station: -1 Exam by:: Windy Kalata, CNM  BBOW--light MSF, small amount bloody show.  Received 2nd dose PCN at 1400  Assessment:  Progressive labor GBS positive  Plan: Start pitocin per low-dose protocol  Brooke Dickerson CNM 08/20/2014, 3:05 PM

## 2014-08-20 NOTE — MAU Note (Signed)
History    Brooke Dickerson is a 33y.o. G2P0 at 39.2wks who presents for vaginal bleeding and contractions.  Patient reports bleeding started around 10pm after sexual intercourse and has been ongoing.  Patient reports one contraction at 0200 and has not perceived any since.  Patient denies LOF and reports active fetus.     Patient Active Problem List   Diagnosis Date Noted  . 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 VULVAR ABSCESS 07/08/2009    No chief complaint on file.  HPI  OB History    Gravida Para Term Preterm AB TAB SAB Ectopic Multiple Living   2    1 1           Past Medical History  Diagnosis Date  . Genital herpes     Past Surgical History  Procedure Laterality Date  . No past surgeries      Family History  Problem Relation Age of Onset  . Heart failure Mother   . Hypertension Mother   . Cancer Father     History  Substance Use Topics  . Smoking status: Never Smoker   . Smokeless tobacco: Not on file  . Alcohol Use: No     Comment: 1 a week    Allergies: No Known Allergies  Prescriptions prior to admission  Medication Sig Dispense Refill Last Dose  . acetaminophen (TYLENOL) 325 MG tablet Take 325 mg by mouth every 6 (six) hours as needed.   08/19/2014 at Unknown time  . diphenhydrAMINE (BENADRYL) 25 MG tablet Take 25 mg by mouth every 6 (six) hours as needed.   Past Week at Unknown time  . Prenatal Vit-Fe Fumarate-FA (PRENATAL MULTIVITAMIN) TABS tablet Take 1 tablet by mouth daily at 12 noon.   08/19/2014 at Unknown time    ROS  See HPI Above Physical Exam   Blood pressure 138/84, pulse 79, temperature 98.2 F (36.8 C), temperature source Oral, resp. rate 16, height 5\' 2"  (1.575 m), weight 76.204 kg (168 lb), last menstrual period 08/24/2013, SpO2 99 %.  Results for orders placed or performed during the hospital encounter of 08/20/14 (from the past 24 hour(s))  OB RESULT CONSOLE Group B Strep     Status: None   Collection Time: 08/20/14 12:00 AM  Result Value Ref Range    GBS Positive     Physical Exam  Constitutional: She is oriented to person, place, and time. She appears well-developed and well-nourished.  HENT:  Head: Normocephalic and atraumatic.  Eyes: EOM are normal.  Neck: Normal range of motion.  Cardiovascular: Normal rate, regular rhythm and normal heart sounds.   Respiratory: Effort normal and breath sounds normal.  GI: Soft. Bowel sounds are normal.  Genitourinary:  Sterile Speculum Exam: -Introitus: Stream of blood noted -Vaginal Vault: Blood noted, no active bleeding seen. Small nickel sized clot removed -Cervix:  Not well visualized due to vaginal wall collapse. Bimanual Exam: Closed/50/-3-Scant amt blood noted on exam glove  Musculoskeletal: Normal range of motion.  Neurological: She is alert and oriented to person, place, and time.  Skin: Skin is warm and dry.   FHR:145 bpm, Mod Var, -Decels, +Accels UC: Q2-29min, palpates mild ED Course  Assessment: IUP at 39.2wks Cat I FT Vaginal Bleeding  Plan: -PE as above -Discussed findings -Will monitor for 2 hours and reassess  Follow Up (8315) -Patient c/o increased perception and pain with contractions -SVE the same -VB apparent and blood noted at introitus -Dr. Kathlen Mody consulted who advises: --Continue to observe --Have patient ambulate with,  pad on, to determine extent of bleeding -Discussed POC with patient who verbalizes understanding -Report to be given to oncoming CNM, V. Madelyn Flavors, Levante Simones LYNN CNM, MSN 08/20/2014 4:38 AM

## 2014-08-20 NOTE — Anesthesia Procedure Notes (Signed)
Epidural Patient location during procedure: OB  Staffing Anesthesiologist: Quinnton Bury Performed by: anesthesiologist   Preanesthetic Checklist Completed: patient identified, site marked, surgical consent, pre-op evaluation, timeout performed, IV checked, risks and benefits discussed and monitors and equipment checked  Epidural Patient position: sitting Prep: site prepped and draped and DuraPrep Patient monitoring: continuous pulse ox and blood pressure Approach: midline Location: L3-L4 Injection technique: LOR saline  Needle:  Needle type: Tuohy  Needle gauge: 17 G Needle length: 9 cm and 9 Needle insertion depth: 5 cm cm Catheter type: closed end flexible Catheter size: 19 Gauge Catheter at skin depth: 10 cm Test dose: negative  Assessment Events: blood not aspirated, injection not painful, no injection resistance, negative IV test and no paresthesia  Additional Notes Patient identified. Risks/Benefits/Options discussed with patient including but not limited to bleeding, infection, nerve damage, paralysis, failed block, incomplete pain control, headache, blood pressure changes, nausea, vomiting, reactions to medication both or allergic, itching and postpartum back pain. Confirmed with bedside nurse the patient's most recent platelet count. Confirmed with patient that they are not currently taking any anticoagulation, have any bleeding history or any family history of bleeding disorders. Patient expressed understanding and wished to proceed. All questions were answered. Sterile technique was used throughout the entire procedure. Please see nursing notes for vital signs. Test dose was given through epidural catheter and negative prior to continuing to dose epidural or start infusion. Warning signs of high block given to the patient including shortness of breath, tingling/numbness in hands, complete motor block, or any concerning symptoms with instructions to call for help. Patient was  given instructions on fall risk and not to get out of bed. All questions and concerns addressed with instructions to call with any issues or inadequate analgesia.      

## 2014-08-20 NOTE — Progress Notes (Signed)
  Subjective: Breathing with UCs, husband at bedside, supportive.  Objective: BP 138/84 mmHg  Pulse 64  Temp(Src) 98.1 F (36.7 C) (Oral)  Resp 18  Ht 5\' 2"  (1.575 m)  Wt 76.204 kg (168 lb)  BMI 30.72 kg/m2  SpO2 99%  LMP 08/24/2013      FHT: Category 1 UC:   irregular, every q 3-8 minutes SVE:   Dilation: 4 Effacement (%): 100 Station: -1 Exam by:: Cira Servant, CNM  Small amount dark blood on pad, no active bleeding   Assessment:  Early labor Vaginal bleeding GBS positive  Plan: Recommend AROM and pitocin. Patient requests epidural prior to those activities.   Sevrin Sally CNM 08/20/2014, 1:32 PM

## 2014-08-21 ENCOUNTER — Encounter (HOSPITAL_COMMUNITY): Payer: Self-pay | Admitting: General Practice

## 2014-08-21 LAB — CBC
HEMATOCRIT: 38.9 % (ref 36.0–46.0)
Hemoglobin: 13.6 g/dL (ref 12.0–15.0)
MCH: 33.4 pg (ref 26.0–34.0)
MCHC: 35 g/dL (ref 30.0–36.0)
MCV: 95.6 fL (ref 78.0–100.0)
PLATELETS: 212 10*3/uL (ref 150–400)
RBC: 4.07 MIL/uL (ref 3.87–5.11)
RDW: 14.7 % (ref 11.5–15.5)
WBC: 13.1 10*3/uL — ABNORMAL HIGH (ref 4.0–10.5)

## 2014-08-21 LAB — PROTEIN / CREATININE RATIO, URINE
CREATININE, URINE: 55 mg/dL
Protein Creatinine Ratio: 0.49 mg/mg{Cre} — ABNORMAL HIGH (ref 0.00–0.15)
Total Protein, Urine: 27 mg/dL

## 2014-08-21 LAB — COMPREHENSIVE METABOLIC PANEL
ALT: 13 U/L — ABNORMAL LOW (ref 14–54)
ANION GAP: 8 (ref 5–15)
AST: 17 U/L (ref 15–41)
Albumin: 2.8 g/dL — ABNORMAL LOW (ref 3.5–5.0)
Alkaline Phosphatase: 238 U/L — ABNORMAL HIGH (ref 38–126)
BUN: 7 mg/dL (ref 6–20)
CALCIUM: 8.3 mg/dL — AB (ref 8.9–10.3)
CO2: 18 mmol/L — ABNORMAL LOW (ref 22–32)
Chloride: 103 mmol/L (ref 101–111)
Creatinine, Ser: 0.71 mg/dL (ref 0.44–1.00)
GFR calc non Af Amer: >60 mL/min (ref >60)
GLUCOSE: 106 mg/dL — AB (ref 65–99)
Potassium: 3.9 mmol/L (ref 3.5–5.1)
Sodium: 129 mmol/L — ABNORMAL LOW (ref 135–145)
Total Bilirubin: 1.4 mg/dL — ABNORMAL HIGH (ref 0.3–1.2)
Total Protein: 5.4 g/dL — ABNORMAL LOW (ref 6.5–8.1)

## 2014-08-21 LAB — HIV ANTIBODY (ROUTINE TESTING W REFLEX): HIV SCREEN 4TH GENERATION: NONREACTIVE

## 2014-08-21 LAB — LACTATE DEHYDROGENASE: LDH: 164 U/L (ref 98–192)

## 2014-08-21 LAB — RPR: RPR Ser Ql: NONREACTIVE

## 2014-08-21 LAB — URIC ACID: URIC ACID, SERUM: 5 mg/dL (ref 2.3–6.6)

## 2014-08-21 MED ORDER — ACETAMINOPHEN 325 MG PO TABS: 650.0000 mg | ORAL_TABLET | ORAL | Status: DC | PRN

## 2014-08-21 MED ORDER — OXYCODONE-ACETAMINOPHEN 5-325 MG PO TABS: 2.0000 | ORAL_TABLET | ORAL | Status: DC | PRN

## 2014-08-21 MED ORDER — TETANUS-DIPHTH-ACELL PERTUSSIS 5-2.5-18.5 LF-MCG/0.5 IM SUSP: 0.5000 mL | Freq: Once | INTRAMUSCULAR | Status: DC

## 2014-08-21 MED ORDER — SENNOSIDES-DOCUSATE SODIUM 8.6-50 MG PO TABS
2.0000 | ORAL_TABLET | ORAL | Status: DC
  Administered 2014-08-21 – 2014-08-22 (×2): 2 via ORAL
  Filled 2014-08-21 (×2): qty 2

## 2014-08-21 MED ORDER — ZOLPIDEM TARTRATE 5 MG PO TABS: 5.0000 mg | ORAL_TABLET | Freq: Every evening | ORAL | Status: DC | PRN

## 2014-08-21 MED ORDER — DIPHENHYDRAMINE HCL 25 MG PO CAPS: 25.0000 mg | ORAL_CAPSULE | Freq: Four times a day (QID) | ORAL | Status: DC | PRN

## 2014-08-21 MED ORDER — PRENATAL MULTIVITAMIN CH
1.0000 | ORAL_TABLET | Freq: Every day | ORAL | Status: DC
  Administered 2014-08-21 – 2014-08-23 (×3): 1 via ORAL
  Filled 2014-08-21 (×3): qty 1

## 2014-08-21 MED ORDER — DIBUCAINE 1 % RE OINT: 1.0000 "application " | TOPICAL_OINTMENT | RECTAL | Status: DC | PRN

## 2014-08-21 MED ORDER — ONDANSETRON HCL 4 MG/2ML IJ SOLN: 4.0000 mg | INTRAMUSCULAR | Status: DC | PRN

## 2014-08-21 MED ORDER — IBUPROFEN 600 MG PO TABS
600.0000 mg | ORAL_TABLET | Freq: Four times a day (QID) | ORAL | Status: DC
  Administered 2014-08-21 – 2014-08-23 (×9): 600 mg via ORAL
  Filled 2014-08-21 (×9): qty 1

## 2014-08-21 MED ORDER — SIMETHICONE 80 MG PO CHEW: 80.0000 mg | CHEWABLE_TABLET | ORAL | Status: DC | PRN

## 2014-08-21 MED ORDER — ONDANSETRON HCL 4 MG PO TABS: 4.0000 mg | ORAL_TABLET | ORAL | Status: DC | PRN

## 2014-08-21 MED ORDER — BENZOCAINE-MENTHOL 20-0.5 % EX AERO
1.0000 "application " | INHALATION_SPRAY | CUTANEOUS | Status: DC | PRN
  Administered 2014-08-21: 1 via TOPICAL

## 2014-08-21 MED ORDER — LANOLIN HYDROUS EX OINT: TOPICAL_OINTMENT | CUTANEOUS | Status: DC | PRN

## 2014-08-21 MED ORDER — OXYCODONE-ACETAMINOPHEN 5-325 MG PO TABS: 1.0000 | ORAL_TABLET | ORAL | Status: DC | PRN

## 2014-08-21 MED ORDER — WITCH HAZEL-GLYCERIN EX PADS: 1.0000 "application " | MEDICATED_PAD | CUTANEOUS | Status: DC | PRN

## 2014-08-21 NOTE — Progress Notes (Signed)
Trial push again attempted around 0421 with poor effort. Anesthesia called to room to reduce rate -- rate reduced in half. Will continue pushing.  Cat 1 FHRT, ctxs q 1-4 min.  Dr. Alesia Richards updated.   Farrel Gordon, CNM 08/21/14, 04:42 AM

## 2014-08-21 NOTE — Progress Notes (Signed)
  Subjective: Sleeping, yet easily aroused. Denies h/a, visual disturbances, RUQ pain or difficulty breathing.  Objective: BP 120/72 mmHg  Pulse 81  Temp(Src) 98.5 F (36.9 C) (Oral)  Resp 18  Ht 5\' 2"  (1.575 m)  Wt 76.204 kg (168 lb)  BMI 30.72 kg/m2  SpO2 99%  LMP 08/24/2013    Today's Vitals   08/21/14 0159 08/21/14 0229 08/21/14 0259 08/21/14 0321  BP: 121/91 112/60 120/72   Pulse: 96 80 81   Temp:      TempSrc:      Resp: 18  18   Height:      Weight:      SpO2:      PainSc:    0-No pain   Total I/O In: -  Out: 1200 [Urine:1200] Gen: NAD Lungs: CTAB CV: RRR w/o M/R/G Abd: gravid, soft between ctxs, no guarding or rebound Ext: 2+ brachial reflexes bilaterally, no clonus FHT: BL 140 w/ moderate variability, occ accels, no decels UC:   irregular, every 2-3 minutes SVE:   Dilation: 10 Effacement (%): 100 Station: +1 Exam by:: Irena Reichmann, CNM Pitocin at 27 mU/min Small dark bleeding noted to examining glove  Urine PCR = 0.49  Assessment:  2nd stage labor -- trial push initiated, poor effort due to no urge or feeling in bottom Reassuring FHRT PreE  Plan: Allow passive descent x 45 min. If poor pushing effort, will request that epidural be turned down. Pt in agreement. Consult Dr. Alesia Richards re: PCR.  Farrel Gordon CNM 08/21/2014, 3:28 AM

## 2014-08-21 NOTE — Progress Notes (Signed)
Consulted Dr. Alesia Richards. Since pt asymptomatic for preeclampsia, will not administer Magnesium Sulfate at this time.   Farrel Gordon, CNM 08/21/14, 4:23 AM

## 2014-08-21 NOTE — Anesthesia Postprocedure Evaluation (Signed)
  Anesthesia Post-op Note  Patient: Brooke Dickerson  Procedure(s) Performed: * No procedures listed *  Patient Location: Mother/Baby  Anesthesia Type:Epidural  Level of Consciousness: awake  Airway and Oxygen Therapy: Patient Spontanous Breathing  Post-op Pain: mild  Post-op Assessment: Patient's Cardiovascular Status Stable and Respiratory Function Stable              Post-op Vital Signs: stable  Last Vitals:  Filed Vitals:   08/21/14 1427  BP: 122/78  Pulse: 81  Temp: 36.9 C  Resp: 20    Complications: No apparent anesthesia complications

## 2014-08-21 NOTE — Progress Notes (Signed)
Subjective: Sleeping, yet easily aroused.  Objective: BP 104/59 mmHg  Pulse 79  Temp(Src) 98.5 F (36.9 C) (Oral)  Resp 20  Ht 5\' 2"  (1.575 m)  Wt 76.204 kg (168 lb)  BMI 30.72 kg/m2  SpO2 99%  LMP 08/24/2013   Total I/O In: -  Out: 1200 [Urine:1200]  Results for orders placed or performed during the hospital encounter of 08/20/14 (from the past 24 hour(s))  CBC     Status: Abnormal   Collection Time: 08/20/14  8:40 AM  Result Value Ref Range   WBC 11.4 (H) 4.0 - 10.5 K/uL   RBC 4.61 3.87 - 5.11 MIL/uL   Hemoglobin 15.3 (H) 12.0 - 15.0 g/dL   HCT 43.7 36.0 - 46.0 %   MCV 94.8 78.0 - 100.0 fL   MCH 33.2 26.0 - 34.0 pg   MCHC 35.0 30.0 - 36.0 g/dL   RDW 14.8 11.5 - 15.5 %   Platelets 232 150 - 400 K/uL  Type and screen     Status: None (Preliminary result)   Collection Time: 08/20/14  8:40 AM  Result Value Ref Range   ABO/RH(D) AB NEG    Antibody Screen POS    Sample Expiration 08/23/2014    Antibody Identification PASSIVELY ACQUIRED ANTI-D    Unit Number [id number]    Blood Component Type RED CELLS,LR    Unit division 00    Status of Unit ALLOCATED    Transfusion Status OK TO TRANSFUSE    Crossmatch Result COMPATIBLE    Unit Number [id number]    Blood Component Type RBC LR PHER2    Unit division 00    Status of Unit ALLOCATED    Transfusion Status OK TO TRANSFUSE    Crossmatch Result COMPATIBLE   Comprehensive metabolic panel     Status: Abnormal   Collection Time: 08/20/14 11:50 PM  Result Value Ref Range   Sodium 129 (L) 135 - 145 mmol/L   Potassium 3.9 3.5 - 5.1 mmol/L   Chloride 103 101 - 111 mmol/L   CO2 18 (L) 22 - 32 mmol/L   Glucose, Bld 106 (H) 65 - 99 mg/dL   BUN 7 6 - 20 mg/dL   Creatinine, Ser 0.71 0.44 - 1.00 mg/dL   Calcium 8.3 (L) 8.9 - 10.3 mg/dL   Total Protein 5.4 (L) 6.5 - 8.1 g/dL   Albumin 2.8 (L) 3.5 - 5.0 g/dL   AST 17 15 - 41 U/L   ALT 13 (L) 14 - 54 U/L   Alkaline Phosphatase 238 (H) 38 - 126 U/L   Total Bilirubin 1.4  (H) 0.3 - 1.2 mg/dL   GFR calc non Af Amer >60 >60 mL/min   GFR calc Af Amer >60 >60 mL/min   Anion gap 8 5 - 15  Lactate dehydrogenase     Status: None   Collection Time: 08/20/14 11:50 PM  Result Value Ref Range   LDH 164 98 - 192 U/L  Uric acid     Status: None   Collection Time: 08/20/14 11:50 PM  Result Value Ref Range   Uric Acid, Serum 5.0 2.3 - 6.6 mg/dL  CBC     Status: Abnormal   Collection Time: 08/20/14 11:50 PM  Result Value Ref Range   WBC 13.1 (H) 4.0 - 10.5 K/uL   RBC 4.07 3.87 - 5.11 MIL/uL   Hemoglobin 13.6 12.0 - 15.0 g/dL   HCT 38.9 36.0 - 46.0 %   MCV 95.6 78.0 - 100.0 fL  MCH 33.4 26.0 - 34.0 pg   MCHC 35.0 30.0 - 36.0 g/dL   RDW 14.7 11.5 - 15.5 %   Platelets 212 150 - 400 K/uL    FHT: BL 120 w/ moderate variability, +accels, no decels UC:   irregular, every 1-5 minutes SVE:   Dilation: 6 Effacement (%): 80 Station: -1 Exam by:: K. Elnore Cosens Pitocin at 23 mU/min Dark red blood noted to examining glove Cvx feels less swollen  Assessment:  Progressive labor GBS positive Neg preeclampsia labs   Plan: Continue position change/peanut Await PCR results Monitor closely  Farrel Gordon CNM 08/21/2014, 1:15 AM

## 2014-08-22 LAB — CBC
HEMATOCRIT: 37.1 % (ref 36.0–46.0)
Hemoglobin: 12.7 g/dL (ref 12.0–15.0)
MCH: 33 pg (ref 26.0–34.0)
MCHC: 34.2 g/dL (ref 30.0–36.0)
MCV: 96.4 fL (ref 78.0–100.0)
Platelets: 222 10*3/uL (ref 150–400)
RBC: 3.85 MIL/uL — AB (ref 3.87–5.11)
RDW: 15.1 % (ref 11.5–15.5)
WBC: 11.5 10*3/uL — ABNORMAL HIGH (ref 4.0–10.5)

## 2014-08-22 MED ORDER — RHO D IMMUNE GLOBULIN 1500 UNIT/2ML IJ SOSY
300.0000 ug | PREFILLED_SYRINGE | Freq: Once | INTRAMUSCULAR | Status: AC
  Administered 2014-08-22: 300 ug via INTRAVENOUS
  Filled 2014-08-22: qty 2

## 2014-08-22 NOTE — Lactation Note (Addendum)
This note was copied from the chart of Brooke Elsey Xie. Lactation Consultation Note   Mom reported that baby had just eaten and that she wasn't hungry. Asked mom to call for lactation assist with the next feeding. Taught hand expression. Has information on support groups and outpatient services.  Patient Name: Brooke Dickerson [id number] Date: 08/22/2014     Maternal Data    Feeding    LATCH Score/Interventions                      Lactation Tools Discussed/Used     Consult Status      Van Clines 08/22/2014, 5:01 PM

## 2014-08-22 NOTE — Progress Notes (Signed)
Brooke Dickerson   Subjective: Post Partum Day 1 Vaginal delivery, abrasions Patient up ad lib, denies syncope or dizziness. Reports consuming regular diet without issues and denies N/V No issues with urination and reports bleeding is appropriate  Feeding:  breast Contraceptive plan:   unsure  Objective: Temp:  [98 F (36.7 C)-98.6 F (37 C)] 98 F (36.7 C) (07/03 0503) Pulse Rate:  [68-86] 74 (07/03 0503) Resp:  [18-20] 18 (07/03 0503) BP: (121-130)/(76-85) 121/76 mmHg (07/03 0503) SpO2:  [100 %] 100 % (07/02 2148)  Physical Exam:  General: alert and cooperative Ext: WNL, no significant edema. No evidence of DVT seen on physical exam. Breast: Soft filling Lungs: CTAB Heart RRR without murmur  Abdomen:  Soft, fundus firm, lochia scant, + bowel sounds, non distended, non tender Lochia: appropriate Uterine Fundus: firm Laceration: healing well    Recent Labs  08/20/14 2350 08/22/14 0500  HGB 13.6 12.7  HCT 38.9 37.1    Assessment S/P Vaginal Delivery-Day 1 Stable  Normal Involution Breastfeeding   Plan: Continue current care Plan for discharge tomorrow and Breastfeeding Lactation support   Markez Dowland, CNM, MSN 08/22/2014, 10:44 AM

## 2014-08-23 LAB — RH IG WORKUP (INCLUDES ABO/RH)
ABO/RH(D): AB NEG
Fetal Screen: NEGATIVE
GESTATIONAL AGE(WKS): 39.3
Unit division: 0

## 2014-08-23 MED ORDER — OXYCODONE-ACETAMINOPHEN 5-325 MG PO TABS: 1.0000 | ORAL_TABLET | ORAL | Status: DC | PRN

## 2014-08-23 MED ORDER — IBUPROFEN 600 MG PO TABS: 600.0000 mg | ORAL_TABLET | Freq: Four times a day (QID) | ORAL | Status: AC

## 2014-08-23 NOTE — Discharge Summary (Signed)
Vaginal Delivery Discharge Summary  ALL information will be verified prior to discharge  Brooke Dickerson  DOB:    02/19/1981 MRN:    [id number] CSN:    [id number]  Date of admission:                  08/20/14  Date of discharge:                   08/23/14  Procedures this admission: SVD  Date of Delivery: 08/21/14  Newborn Data:  Live born  Information for the patient's newborn:  Abuk, Selleck [[id number]]  female   Live born female  Birth Weight: 7 lb 15.9 oz (3626 g) APGAR: 9, 9  Home with mother. Name: Daia    History of Present Illness: Ms. Brooke Dickerson is a 34 y.o. female, G2P1011, who presents at [redacted]w[redacted]d weeks gestation. The patient has been followed at the [facility] and Gynecology division of [facility]. She was admitted onset of labor. Her pregnancy has been complicated by:  Patient Active Problem List   Diagnosis Date Noted  . Vaginal delivery 08/21/2014  . 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 Rh negative state in antepartum period 08/20/2014  . Vaginal bleeding 08/20/2014  . VULVAR ABSCESS 07/08/2009    Hospital course: The patient was admitted for labor.   Her labor was not complicated. She proceeded to have a vaginal delivery of a healthy infant. Her delivery was not complicated. Her postpartum course was not complicated. She was discharged to home on postpartum day 2 doing well.  Feeding: breast  Contraception: unsure     Discharge hemoglobin: HEMOGLOBIN  Date Value Ref Range Status  08/22/2014 12.7 12.0 - 15.0 g/dL Final   HCT  Date Value Ref Range Status  08/22/2014 37.1 36.0 - 46.0 % Final    PreNatal Labs ABO, Rh: --/--/AB NEG (07/03 0540)  Rhogam given on: 08/21/17 Antibody: POS (07/01 0840) Rubella:    immune RPR: Non Reactive (07/01 0840)  HBsAg: Negative (11/11 0000)  HIV: Non-reactive (11/11 0000)  GBS: Positive (07/01 0000)  Discharge Physical Exam:  General: alert  and cooperative Lochia: appropriate Uterine Fundus: firm Incision: healing well DVT Evaluation: No evidence of DVT seen on physical exam.  Intrapartum Procedures: spontaneous vaginal delivery and GBS prophylaxis Postpartum Procedures: Rho(D) Ig Complications-Operative and Postpartum: Vaginal Abrasion  Discharge Diagnoses: Term Pregnancy-delivered,   Activity:           pelvic rest Diet:                routine Medications: PNV, Ibuprofen and Percocet Condition:      stable     Postpartum Teaching: Nutrition, exercise, return to work or school, family visits, sexual activity, home rest, vaginal bleeding, pelvic rest, family planning, s/s of PPD, breast care peri-care and incision care   Discharge to: home     Cortina Vultaggio, CNM, MSN 08/23/2014. 7:42 AM  All information will be verified prior to discharge   Postpartum Care After Vaginal Delivery  After you deliver your newborn (postpartum period), the usual stay in the hospital is 24 72 hours. If there were problems with your labor or delivery, or if you have other medical problems, you might be in the hospital longer.  While you are in the hospital, you will receive help and instructions on how to care for yourself and your newborn during the postpartum period.  While you are in the hospital:  Be sure to tell your nurses if you have pain or discomfort, as well as where you feel the pain and what makes the pain worse.  If you had an incision made near your vagina (episiotomy) or if you had some tearing during delivery, the nurses may put ice packs on your episiotomy or tear. The ice packs may help to reduce the pain and swelling.  If you are breastfeeding, you may feel uncomfortable contractions of your uterus for a couple of weeks. This is normal. The contractions help your uterus get back to normal size.  It is normal to have some bleeding after delivery.  For the first 1 3 days after delivery, the flow is red and the  amount may be similar to a period.  It is common for the flow to start and stop.  In the first few days, you may pass some small clots. Let your nurses know if you begin to pass large clots or your flow increases.  Do not  flush blood clots down the toilet before having the nurse look at them.  During the next 3 10 days after delivery, your flow should become more watery and pink or brown-tinged in color.  Ten to fourteen days after delivery, your flow should be a small amount of yellowish-white discharge.  The amount of your flow will decrease over the first few weeks after delivery. Your flow may stop in 6 8 weeks. Most women have had their flow stop by 12 weeks after delivery.  You should change your sanitary pads frequently.  Wash your hands thoroughly with soap and water for at least 20 seconds after changing pads, using the toilet, or before holding or feeding your newborn.  You should feel like you need to empty your bladder within the first 6 8 hours after delivery.  In case you become weak, lightheaded, or faint, call your nurse before you get out of bed for the first time and before you take a shower for the first time.  Within the first few days after delivery, your breasts may begin to feel tender and full. This is called engorgement. Breast tenderness usually goes away within 48 72 hours after engorgement occurs. You may also notice milk leaking from your breasts. If you are not breastfeeding, do not stimulate your breasts. Breast stimulation can make your breasts produce more milk.  Spending as much time as possible with your newborn is very important. During this time, you and your newborn can feel close and get to know each other. Having your newborn stay in your room (rooming in) will help to strengthen the bond with your newborn. It will give you time to get to know your newborn and become comfortable caring for your newborn.  Your hormones change after delivery.  Sometimes the hormone changes can temporarily cause you to feel sad or tearful. These feelings should not last more than a few days. If these feelings last longer than that, you should talk to your caregiver.  If desired, talk to your caregiver about methods of family planning or contraception.  Talk to your caregiver about immunizations. Your caregiver may want you to have the following immunizations before leaving the hospital:  Tetanus, diphtheria, and pertussis (Tdap) or tetanus and diphtheria (Td) immunization. It is very important that you and your family (including grandparents) or others caring for your newborn are up-to-date with the Tdap or Td immunizations. The Tdap or Td immunization can help protect your newborn from getting ill.  Rubella immunization.  Varicella (chickenpox) immunization.  Influenza immunization. You should receive this annual immunization if you did not receive the immunization during your pregnancy. Document Released: 12/03/2006 Document Revised: 10/31/2011 Document Reviewed: 10/03/2011 [facility] Patient Information 2014 [place].   Postpartum Depression and Baby Blues  The postpartum period begins right after the birth of a baby. During this time, there is often a great amount of joy and excitement. It is also a time of considerable changes in the life of the parent(s). Regardless of how many times a mother gives birth, each child brings new challenges and dynamics to the family. It is not unusual to have feelings of excitement accompanied by confusing shifts in moods, emotions, and thoughts. All mothers are at risk of developing postpartum depression or the "baby blues." These mood changes can occur right after giving birth, or they may occur many months after giving birth. The baby blues or postpartum depression can be mild or severe. Additionally, postpartum depression can resolve rather quickly, or it can be a long-term condition. CAUSES Elevated  hormones and their rapid decline are thought to be a main cause of postpartum depression and the baby blues. There are a number of hormones that radically change during and after pregnancy. Estrogen and progesterone usually decrease immediately after delivering your baby. The level of thyroid hormone and various cortisol steroids also rapidly drop. Other factors that play a major role in these changes include major life events and genetics.  RISK FACTORS If you have any of the following risks for the baby blues or postpartum depression, know what symptoms to watch out for during the postpartum period. Risk factors that may increase the likelihood of getting the baby blues or postpartum depression include: 1. Havinga personal or family history of depression. 2. Having depression while being pregnant. 3. Having premenstrual or oral contraceptive-associated mood issues. 4. Having exceptional life stress. 5. Having marital conflict. 6. Lacking a social support network. 7. Having a baby with special needs. 8. Having health problems such as diabetes. SYMPTOMS Baby blues symptoms include:  Brief fluctuations in mood, such as going from extreme happiness to sadness.  Decreased concentration.  Difficulty sleeping.  Crying spells, tearfulness.  Irritability.  Anxiety. Postpartum depression symptoms typically begin within the first month after giving birth. These symptoms include:  Difficulty sleeping or excessive sleepiness.  Marked weight loss.  Agitation.  Feelings of worthlessness.  Lack of interest in activity or food. Postpartum psychosis is a very concerning condition and can be dangerous. Fortunately, it is rare. Displaying any of the following symptoms is cause for immediate medical attention. Postpartum psychosis symptoms include:  Hallucinations and delusions.  Bizarre or disorganized behavior.  Confusion or disorientation. DIAGNOSIS  A diagnosis is made by an evaluation  of your symptoms. There are no medical or lab tests that lead to a diagnosis, but there are various questionnaires that a caregiver may use to identify those with the baby blues, postpartum depression, or psychosis. Often times, a screening tool called the [place] Postnatal Depression Scale is used to diagnose depression in the postpartum period.  TREATMENT The baby blues usually goes away on its own in 1 to 2 weeks. Social support is often all that is needed. You should be encouraged to get adequate sleep and rest. Occasionally, you may be given medicines to help you sleep.  Postpartum depression requires treatment as it can last several months or longer if it is not treated. Treatment may include individual or group therapy, medicine, or  both to address any social, physiological, and psychological factors that may play a role in the depression. Regular exercise, a healthy diet, rest, and social support may also be strongly recommended.  Postpartum psychosis is more serious and needs treatment right away. Hospitalization is often needed. HOME CARE INSTRUCTIONS  Get as much rest as you can. Nap when the baby sleeps.  Exercise regularly. Some women find yoga and walking to be beneficial.  Eat a balanced and nourishing diet.  Do little things that you enjoy. Have a cup of tea, take a bubble bath, read your favorite magazine, or listen to your favorite music.  Avoid alcohol.  Ask for help with household chores, cooking, grocery shopping, or running errands as needed. Do not try to do everything.  Talk to people close to you about how you are feeling. Get support from your partner, family members, friends, or other new moms.  Try to stay positive in how you think. Think about the things you are grateful for.  Do not spend a lot of time alone.  Only take medicine as directed by your caregiver.  Keep all your postpartum appointments.  Let your caregiver know if you have any concerns. SEEK  MEDICAL CARE IF: You are having a reaction or problems with your medicine. SEEK IMMEDIATE MEDICAL CARE IF:  You have suicidal feelings.  You feel you may harm the baby or someone else. Document Released: 11/10/2003 Document Revised: 04/30/2011 Document Reviewed: 12/12/2010 [facility] Patient Information 2014 [place], [place].     Breastfeeding Deciding to breastfeed is one of the best choices you can make for you and your baby. A change in hormones during pregnancy causes your breast tissue to grow and increases the number and size of your milk ducts. These hormones also allow proteins, sugars, and fats from your blood supply to make breast milk in your milk-producing glands. Hormones prevent breast milk from being released before your baby is born as well as prompt milk flow after birth. Once breastfeeding has begun, thoughts of your baby, as well as his or her sucking or crying, can stimulate the release of milk from your milk-producing glands.  BENEFITS OF BREASTFEEDING For Your Baby  Your first milk (colostrum) helps your baby's digestive system function better.   There are antibodies in your milk that help your baby fight off infections.   Your baby has a lower incidence of asthma, allergies, and sudden infant death syndrome.   The nutrients in breast milk are better for your baby than infant formulas and are designed uniquely for your baby's needs.   Breast milk improves your baby's brain development.   Your baby is less likely to develop other conditions, such as childhood obesity, asthma, or type 2 diabetes mellitus.  For You   Breastfeeding helps to create a very special bond between you and your baby.   Breastfeeding is convenient. Breast milk is always available at the correct temperature and costs nothing.   Breastfeeding helps to burn calories and helps you lose the weight gained during pregnancy.   Breastfeeding makes your uterus contract to its prepregnancy  size faster and slows bleeding (lochia) after you give birth.   Breastfeeding helps to lower your risk of developing type 2 diabetes mellitus, osteoporosis, and breast or ovarian cancer later in life. SIGNS THAT YOUR BABY IS HUNGRY Early Signs of Hunger  Increased alertness or activity.  Stretching.  Movement of the head from side to side.  Movement of the head and opening of  the mouth when the corner of the mouth or cheek is stroked (rooting).  Increased sucking sounds, smacking lips, cooing, sighing, or squeaking.  Hand-to-mouth movements.  Increased sucking of fingers or hands. Late Signs of Hunger  Fussing.  Intermittent crying. Extreme Signs of Hunger Signs of extreme hunger will require calming and consoling before your baby will be able to breastfeed successfully. Do not wait for the following signs of extreme hunger to occur before you initiate breastfeeding:   Restlessness.  A loud, strong cry.   Screaming.   BREASTFEEDING BASICS Breastfeeding Initiation  Find a comfortable place to sit or lie down, with your neck and back well supported.  Place a pillow or rolled up blanket under your baby to bring him or her to the level of your breast (if you are seated). Nursing pillows are specially designed to help support your arms and your baby while you breastfeed.  Make sure that your baby's abdomen is facing your abdomen.   Gently massage your breast. With your fingertips, massage from your chest wall toward your nipple in a circular motion. This encourages milk flow. You may need to continue this action during the feeding if your milk flows slowly.  Support your breast with 4 fingers underneath and your thumb above your nipple. Make sure your fingers are well away from your nipple and your baby's mouth.   Stroke your baby's lips gently with your finger or nipple.   When your baby's mouth is open wide enough, quickly bring your baby to your breast, placing  your entire nipple and as much of the colored area around your nipple (areola) as possible into your baby's mouth.   More areola should be visible above your baby's upper lip than below the lower lip.   Your baby's tongue should be between his or her lower gum and your breast.   Ensure that your baby's mouth is correctly positioned around your nipple (latched). Your baby's lips should create a seal on your breast and be turned out (everted).  It is common for your baby to suck about 2-3 minutes in order to start the flow of breast milk. Latching Teaching your baby how to latch on to your breast properly is very important. An improper latch can cause nipple pain and decreased milk supply for you and poor weight gain in your baby. Also, if your baby is not latched onto your nipple properly, he or she may swallow some air during feeding. This can make your baby fussy. Burping your baby when you switch breasts during the feeding can help to get rid of the air. However, teaching your baby to latch on properly is still the best way to prevent fussiness from swallowing air while breastfeeding. Signs that your baby has successfully latched on to your nipple:    Silent tugging or silent sucking, without causing you pain.   Swallowing heard between every 3-4 sucks.    Muscle movement above and in front of his or her ears while sucking.  Signs that your baby has not successfully latched on to nipple:   Sucking sounds or smacking sounds from your baby while breastfeeding.  Nipple pain. If you think your baby has not latched on correctly, slip your finger into the corner of your baby's mouth to break the suction and place it between your baby's gums. Attempt breastfeeding initiation again. Signs of Successful Breastfeeding Signs from your baby:   A gradual decrease in the number of sucks or complete cessation of  sucking.   Falling asleep.   Relaxation of his or her body.   Retention of  a small amount of milk in his or her mouth.   Letting go of your breast by himself or herself. Signs from you:  Breasts that have increased in firmness, weight, and size 1-3 hours after feeding.   Breasts that are softer immediately after breastfeeding.  Increased milk volume, as well as a change in milk consistency and color by the fifth day of breastfeeding.   Nipples that are not sore, cracked, or bleeding. Signs That Your [employer] is Getting Enough Milk  Wetting at least 3 diapers in a 24-hour period. The urine should be clear and pale yellow by age 9 days.  At least 3 stools in a 24-hour period by age 9 days. The stool should be soft and yellow.  At least 3 stools in a 24-hour period by age 24 days. The stool should be seedy and yellow.  No loss of weight greater than 10% of birth weight during the first 21 days of age.  Average weight gain of 4-7 ounces (113-198 g) per week after age 68 days.  Consistent daily weight gain by age 95 days, without weight loss after the age of 2 weeks. After a feeding, your baby may spit up a small amount. This is common. BREASTFEEDING FREQUENCY AND DURATION Frequent feeding will help you make more milk and can prevent sore nipples and breast engorgement. Breastfeed when you feel the need to reduce the fullness of your breasts or when your baby shows signs of hunger. This is called "breastfeeding on demand." Avoid introducing a pacifier to your baby while you are working to establish breastfeeding (the first 4-6 weeks after your baby is born). After this time you may choose to use a pacifier. Research has shown that pacifier use during the first year of a baby's life decreases the risk of sudden infant death syndrome (SIDS). Allow your baby to feed on each breast as long as he or she wants. Breastfeed until your baby is finished feeding. When your baby unlatches or falls asleep while feeding from the first breast, offer the second breast. Because newborns  are often sleepy in the first few weeks of life, you may need to awaken your baby to get him or her to feed. Breastfeeding times will vary from baby to baby. However, the following rules can serve as a guide to help you ensure that your baby is properly fed:  Newborns (babies 43 weeks of age or younger) may breastfeed every 1-3 hours.  Newborns should not go longer than 3 hours during the day or 5 hours during the night without breastfeeding.  You should breastfeed your baby a minimum of 8 times in a 24-hour period until you begin to introduce solid foods to your baby at around 62 months of age. BREAST MILK PUMPING Pumping and storing breast milk allows you to ensure that your baby is exclusively fed your breast milk, even at times when you are unable to breastfeed. This is especially important if you are going back to work while you are still breastfeeding or when you are not able to be present during feedings. Your lactation consultant can give you guidelines on how long it is safe to store breast milk.  A breast pump is a machine that allows you to pump milk from your breast into a sterile bottle. The pumped breast milk can then be stored in a refrigerator or freezer. Some breast  pumps are operated by hand, while others use electricity. Ask your lactation consultant which type will work best for you. Breast pumps can be purchased, but some hospitals and breastfeeding support groups lease breast pumps on a monthly basis. A lactation consultant can teach you how to hand express breast milk, if you prefer not to use a pump.  CARING FOR YOUR BREASTS WHILE YOU BREASTFEED Nipples can become dry, cracked, and sore while breastfeeding. The following recommendations can help keep your breasts moisturized and healthy:  Avoid using soap on your nipples.   Wear a supportive bra. Although not required, special nursing bras and tank tops are designed to allow access to your breasts for breastfeeding without  taking off your entire bra or top. Avoid wearing underwire-style bras or extremely tight bras.  Air dry your nipples for 3-81minutes after each feeding.   Use only cotton bra pads to absorb leaked breast milk. Leaking of breast milk between feedings is normal.   Use lanolin on your nipples after breastfeeding. Lanolin helps to maintain your skin's normal moisture barrier. If you use pure lanolin, you do not need to wash it off before feeding your baby again. Pure lanolin is not toxic to your baby. You may also hand express a few drops of breast milk and gently massage that milk into your nipples and allow the milk to air dry. In the first few weeks after giving birth, some women experience extremely full breasts (engorgement). Engorgement can make your breasts feel heavy, warm, and tender to the touch. Engorgement peaks within 3-5 days after you give birth. The following recommendations can help ease engorgement:  Completely empty your breasts while breastfeeding or pumping. You may want to start by applying warm, moist heat (in the shower or with warm water-soaked hand towels) just before feeding or pumping. This increases circulation and helps the milk flow. If your baby does not completely empty your breasts while breastfeeding, pump any extra milk after he or she is finished.  Wear a snug bra (nursing or regular) or tank top for 1-2 days to signal your body to slightly decrease milk production.  Apply ice packs to your breasts, unless this is too uncomfortable for you.  Make sure that your baby is latched on and positioned properly while breastfeeding. If engorgement persists after 48 hours of following these recommendations, contact your health care provider or a Science writer. OVERALL HEALTH CARE RECOMMENDATIONS WHILE BREASTFEEDING  Eat healthy foods. Alternate between meals and snacks, eating 3 of each per day. Because what you eat affects your breast milk, some of the foods may  make your baby more irritable than usual. Avoid eating these foods if you are sure that they are negatively affecting your baby.  Drink milk, fruit juice, and water to satisfy your thirst (about 10 glasses a day).   Rest often, relax, and continue to take your prenatal vitamins to prevent fatigue, stress, and anemia.  Continue breast self-awareness checks.  Avoid chewing and smoking tobacco.  Avoid alcohol and drug use. Some medicines that may be harmful to your baby can pass through breast milk. It is important to ask your health care provider before taking any medicine, including all over-the-counter and prescription medicine as well as vitamin and herbal supplements. It is possible to become pregnant while breastfeeding. If birth control is desired, ask your health care provider about options that will be safe for your baby. SEEK MEDICAL CARE IF:   You feel like you want to stop  breastfeeding or have become frustrated with breastfeeding.  You have painful breasts or nipples.  Your nipples are cracked or bleeding.  Your breasts are red, tender, or warm.  You have a swollen area on either breast.  You have a fever or chills.  You have nausea or vomiting.  You have drainage other than breast milk from your nipples.  Your breasts do not become full before feedings by the fifth day after you give birth.  You feel sad and depressed.  Your baby is too sleepy to eat well.  Your baby is having trouble sleeping.   Your baby is wetting less than 3 diapers in a 24-hour period.  Your baby has less than 3 stools in a 24-hour period.  Your baby's skin or the white part of his or her eyes becomes yellow.   Your baby is not gaining weight by 39 days of age. SEEK IMMEDIATE MEDICAL CARE IF:   Your baby is overly tired (lethargic) and does not want to wake up and feed.  Your baby develops an unexplained fever. Document Released: 02/05/2005 Document Revised: 02/10/2013 Document  Reviewed: 07/30/2012 [facility] Patient Information 2015 [place], [place]. This information is not intended to replace advice given to you by your health care provider. Make sure you discuss any questions you have with your health care provider.

## 2014-08-23 NOTE — Lactation Note (Signed)
This note was copied from the chart of Brooke Kharma Seufert. Lactation Consultation Note  Follow up visit made.  Mom states baby is cluster feeding this AM.  Reassured and discussed milk coming to volume.  Breast massage/compression encouraged during feeding.  Instructed to keep a feeding diary the first week home.  Outpatient services and support reviewed.  Mom will contact us prn.  Patient Name: Brooke Dickerson [id number] Date: 08/23/2014     Maternal Data    Feeding Feeding Type: Breast Fed Length of feed: 15 min  LATCH Score/Interventions                      Lactation Tools Discussed/Used     Consult Status      Ave Filter 08/23/2014, 9:43 AM

## 2014-08-24 LAB — TYPE AND SCREEN
ABO/RH(D): AB NEG
Antibody Screen: POSITIVE
Unit division: 0
Unit division: 0

## 2014-08-27 ENCOUNTER — Ambulatory Visit (INDEPENDENT_AMBULATORY_CARE_PROVIDER_SITE_OTHER): Payer: PRIVATE HEALTH INSURANCE | Admitting: Urgent Care

## 2014-08-27 VITALS — BP 112/68 | HR 66 | Temp 98.5°F | Resp 18 | Ht 63.0 in | Wt 154.0 lb

## 2014-08-27 DIAGNOSIS — H6122 Impacted cerumen, left ear: Secondary | ICD-10-CM

## 2014-08-27 NOTE — Patient Instructions (Addendum)
-   Please pick up an over-the-counter earwax solution and use this for the next few days. This is also called ceruminolytic.  - If you start to develop fevers, increasing ear pain or ear drainage given me a call and we'll see about doing an antibiotic either through.  Cerumen Impaction A cerumen impaction is when the wax in your ear forms a plug. This plug usually causes reduced hearing. Sometimes it also causes an earache or dizziness. Removing a cerumen impaction can be difficult and painful. The wax sticks to the ear canal. The canal is sensitive and bleeds easily. If you try to remove a heavy wax buildup with a cotton tipped swab, you may push it in further. Irrigation with water, suction, and small ear curettes may be used to clear out the wax. If the impaction is fixed to the skin in the ear canal, ear drops may be needed for a few days to loosen the wax. People who build up a lot of wax frequently can use ear wax removal products available in your local drugstore. SEEK MEDICAL CARE IF:  You develop an earache, increased hearing loss, or marked dizziness. Document Released: 03/15/2004 Document Revised: 04/30/2011 Document Reviewed: 05/05/2009 [facility] Patient Information 2015 [place], [place]. This information is not intended to replace advice given to you by your health care provider. Make sure you discuss any questions you have with your health care provider.

## 2014-08-27 NOTE — Progress Notes (Signed)
    MRN: [id number] DOB: 1980/06/25  Subjective:   Brooke Dickerson is a 34 y.o. female presenting for chief complaint of Ear Pain  Reports 3 week history of left ear fullness, intermittent mild left ear pain, occasional ringing of her left ear. patient reports that she went swimming and [place] about 3 weeks ago and has since had this problem. Denies fevers, ear drainage, sinus pain, sinus congestion, tooth pain, throat pain. Denies any other aggravating or relieving factors, no other questions or concerns.  Brooke Dickerson has a current medication list which includes the following prescription(s): oxycodone-acetaminophen, prenatal multivitamin, and ibuprofen, and the following Facility-Administered Medications: lidocaine (pf). She has No Known Allergies.  Brooke Dickerson  has no past medical history on file. Also  has past surgical history that includes No past surgeries.  ROS As in subjective.  Objective:   Vitals: BP 112/68 mmHg  Pulse 66  Temp(Src) 98.5 F (36.9 C) (Oral)  Resp 18  Ht 5\' 3"  (1.6 m)  Wt 154 lb (69.854 kg)  BMI 27.29 kg/m2  SpO2 99%  LMP 08/20/2013  Physical Exam  Constitutional: She appears well-developed and well-nourished.  HENT:  Left TM cerumen occluded, right TM intact and no effusions or erythema. External ear non-tender bilaterally. Nasal turbinates pink and moist with clear mucus. No sinus tenderness. Throat without oropharyngeal exudates, erythema or abscesses.  Eyes: Conjunctivae are normal. Right eye exhibits no discharge. Left eye exhibits no discharge. No scleral icterus.  Cardiovascular: Normal rate.   Pulmonary/Chest: Effort normal. No stridor.  Lymphadenopathy:    She has no cervical adenopathy.  Skin: Skin is warm and dry. No rash noted. No erythema. No pallor.   S/p ear irrigation left TM was more visible but still had obvious thick cerumen. What was seen of left TM did not look infectious, no erythema, TM was intact.  Assessment and Plan :   1.  Cerumen impaction, left - Stable, advised that she continue ceruminolytic at home, rtc if symptoms worsen. Consider topical or oral antibiotic depending on symptoms.    Brooke Eagles, PA-C [facility] [phone] 08/27/2014 2:34 PM

## 2014-08-31 ENCOUNTER — Emergency Department (HOSPITAL_COMMUNITY)
Admission: EM | Admit: 2014-08-31 | Discharge: 2014-08-31 | Disposition: A | Discharge status: Home / Self Care | Payer: PRIVATE HEALTH INSURANCE | Attending: Emergency Medicine | Admitting: Emergency Medicine

## 2014-08-31 ENCOUNTER — Encounter (HOSPITAL_COMMUNITY): Payer: Self-pay | Admitting: Emergency Medicine

## 2014-08-31 DIAGNOSIS — R112 Nausea with vomiting, unspecified: Secondary | ICD-10-CM | POA: Diagnosis not present

## 2014-08-31 DIAGNOSIS — O9089 Other complications of the puerperium, not elsewhere classified: Secondary | ICD-10-CM | POA: Insufficient documentation

## 2014-08-31 DIAGNOSIS — Z79899 Other long term (current) drug therapy: Secondary | ICD-10-CM | POA: Diagnosis not present

## 2014-08-31 LAB — COMPREHENSIVE METABOLIC PANEL
ALT: 20 U/L (ref 14–54)
AST: 16 U/L (ref 15–41)
Albumin: 3.3 g/dL — ABNORMAL LOW (ref 3.5–5.0)
Alkaline Phosphatase: 140 U/L — ABNORMAL HIGH (ref 38–126)
Anion gap: 9 (ref 5–15)
BUN: 6 mg/dL (ref 6–20)
CO2: 23 mmol/L (ref 22–32)
Calcium: 8.7 mg/dL — ABNORMAL LOW (ref 8.9–10.3)
Chloride: 105 mmol/L (ref 101–111)
Creatinine, Ser: 0.63 mg/dL (ref 0.44–1.00)
GFR calc Af Amer: >60 mL/min (ref >60)
GFR calc non Af Amer: >60 mL/min (ref >60)
Glucose, Bld: 100 mg/dL — ABNORMAL HIGH (ref 65–99)
Potassium: 3.7 mmol/L (ref 3.5–5.1)
Sodium: 137 mmol/L (ref 135–145)
Total Bilirubin: 0.9 mg/dL (ref 0.3–1.2)
Total Protein: 6.7 g/dL (ref 6.5–8.1)

## 2014-08-31 LAB — URINALYSIS, ROUTINE W REFLEX MICROSCOPIC
Bilirubin Urine: NEGATIVE
Glucose, UA: NEGATIVE mg/dL
Ketones, ur: 15 mg/dL — AB
Nitrite: NEGATIVE
Protein, ur: NEGATIVE mg/dL
Specific Gravity, Urine: 1.015 (ref 1.005–1.030)
Urobilinogen, UA: 1 mg/dL (ref 0.0–1.0)
pH: 6.5 (ref 5.0–8.0)

## 2014-08-31 LAB — CBC WITH DIFFERENTIAL/PLATELET
Basophils Absolute: 0 10*3/uL (ref 0.0–0.1)
Basophils Relative: 0 % (ref 0–1)
Eosinophils Absolute: 0.1 10*3/uL (ref 0.0–0.7)
Eosinophils Relative: 1 % (ref 0–5)
HCT: 42.9 % (ref 36.0–46.0)
Hemoglobin: 14.8 g/dL (ref 12.0–15.0)
Lymphocytes Relative: 23 % (ref 12–46)
Lymphs Abs: 1.6 10*3/uL (ref 0.7–4.0)
MCH: 32.3 pg (ref 26.0–34.0)
MCHC: 34.5 g/dL (ref 30.0–36.0)
MCV: 93.7 fL (ref 78.0–100.0)
Monocytes Absolute: 0.3 10*3/uL (ref 0.1–1.0)
Monocytes Relative: 4 % (ref 3–12)
Neutro Abs: 5.1 10*3/uL (ref 1.7–7.7)
Neutrophils Relative %: 72 % (ref 43–77)
Platelets: 459 10*3/uL — ABNORMAL HIGH (ref 150–400)
RBC: 4.58 MIL/uL (ref 3.87–5.11)
RDW: 13.7 % (ref 11.5–15.5)
WBC: 7.1 10*3/uL (ref 4.0–10.5)

## 2014-08-31 LAB — URINE MICROSCOPIC-ADD ON

## 2014-08-31 MED ORDER — ONDANSETRON HCL 4 MG/2ML IJ SOLN
4.0000 mg | Freq: Once | INTRAMUSCULAR | Status: AC
  Administered 2014-08-31: 4 mg via INTRAVENOUS
  Filled 2014-08-31: qty 2

## 2014-08-31 MED ORDER — ONDANSETRON HCL 4 MG/2ML IJ SOLN
INTRAMUSCULAR | Status: AC
  Filled 2014-08-31: qty 2

## 2014-08-31 MED ORDER — ONDANSETRON HCL 4 MG PO TABS: 4.0000 mg | ORAL_TABLET | Freq: Four times a day (QID) | ORAL | Status: DC

## 2014-08-31 MED ORDER — SODIUM CHLORIDE 0.9 % IV BOLUS (SEPSIS)
1000.0000 mL | Freq: Once | INTRAVENOUS | Status: AC
  Administered 2014-08-31: 1000 mL via INTRAVENOUS

## 2014-08-31 NOTE — ED Notes (Signed)
Pt. Stated, I threw up on sat and felt sick, fine on Sun. And last night I felt sick again.  I threw up again around 600 this morning.  Right now I just feel very weak, I delivered a baby July 2, They did say I had protein in my urine.

## 2014-08-31 NOTE — ED Notes (Addendum)
PA Lawyer at bedside with the patient reviewing discharge instructions.  Patient concerned with BP.  PA reassured patient and confirmed that patient would follow up with physician and gynecologist.

## 2014-08-31 NOTE — ED Notes (Signed)
PA Lawyer made aware of patients BP and HR from previous trending vital signs.  Patient also c/o of feeling nauseated with no vomiting.  See MAR for orders.

## 2014-08-31 NOTE — Discharge Instructions (Signed)
Return here as needed.  Follow-up with your doctor this week

## 2014-08-31 NOTE — ED Provider Notes (Signed)
CSN: [id number]     Arrival date & time 08/31/14  0813 History   First MD Initiated Contact with Patient 08/31/14 [phone]     Chief Complaint  Patient presents with  . Emesis     (Consider location/radiation/quality/duration/timing/severity/associated sxs/prior Treatment) HPI Patient presents to the emergency department with vomiting that started this past Saturday.  The patient states that she recently gave birth several weeks ago but with her doctor who advised her to come to the emergency department for further evaluation.  Patient states when she left the hospital.  They did note some protein in her urine, but no other abnormalities noted patients from the delivery.  Patient denies chest pain, shortness breath, weakness, dizziness, headache, blurred vision, back pain, dysuria, fever, incontinence(extremity swelling or syncope.  The patient states that nothing seems make her condition better or worse  History reviewed. No pertinent past medical history. Past Surgical History  Procedure Laterality Date  . No past surgeries     Family History  Problem Relation Age of Onset  . Heart failure Mother   . Hypertension Mother   . Cancer Father    History  Substance Use Topics  . Smoking status: Never Smoker   . Smokeless tobacco: Not on file  . Alcohol Use: No     Comment: 1 a week   OB History    Gravida Para Term Preterm AB TAB SAB Ectopic Multiple Living   2 1 1  1 1    0 1     Review of Systems All other systems negative except as documented in the HPI. All pertinent positives and negatives as reviewed in the HPI.  Allergies  Review of patient's allergies indicates no known allergies.  Home Medications   Prior to Admission medications   Medication Sig Start Date End Date Taking? Authorizing Provider  oxyCODONE-acetaminophen (PERCOCET/ROXICET) 5-325 MG per tablet Take 1 tablet by mouth every 4 (four) hours as needed (for pain scale 4-7). 08/23/14  Yes Venus Standard, CNM   ibuprofen (ADVIL,MOTRIN) 600 MG tablet Take 1 tablet (600 mg total) by mouth every 6 (six) hours. Patient not taking: Reported on 08/27/2014 08/23/14   Venus Standard, CNM  Prenatal Vit-Fe Fumarate-FA (PRENATAL MULTIVITAMIN) TABS tablet Take 1 tablet by mouth daily at 12 noon.    Historical Provider, MD   BP 167/94 mmHg  Pulse 57  Temp(Src) 98.3 F (36.8 C) (Oral)  Resp 16  Ht 5\' 2"  (1.575 m)  Wt 154 lb (69.854 kg)  BMI 28.16 kg/m2  SpO2 100%  LMP 08/20/2013 Physical Exam  Constitutional: She is oriented to person, place, and time. She appears well-developed and well-nourished. No distress.  HENT:  Head: Normocephalic and atraumatic.  Mouth/Throat: Oropharynx is clear and moist.  Eyes: Pupils are equal, round, and reactive to light.  Neck: Normal range of motion. Neck supple.  Cardiovascular: Normal rate, regular rhythm and normal heart sounds.  Exam reveals no gallop and no friction rub.   No murmur heard. Pulmonary/Chest: Effort normal and breath sounds normal. No respiratory distress.  Abdominal: Soft. Bowel sounds are normal. She exhibits no distension. There is no tenderness.  Musculoskeletal: She exhibits no edema.  Neurological: She is alert and oriented to person, place, and time. She exhibits normal muscle tone. Coordination normal.  Skin: Skin is warm and dry. No rash noted. No erythema.  Psychiatric: She has a normal mood and affect. Her behavior is normal.  Nursing note and vitals reviewed.   ED Course  Procedures (including  critical care time) Labs Review Labs Reviewed  COMPREHENSIVE METABOLIC PANEL - Abnormal; Notable for the following:    Glucose, Bld 100 (*)    Calcium 8.7 (*)    Albumin 3.3 (*)    Alkaline Phosphatase 140 (*)    All other components within normal limits  CBC WITH DIFFERENTIAL/PLATELET - Abnormal; Notable for the following:    Platelets 459 (*)    All other components within normal limits  URINALYSIS, ROUTINE W REFLEX MICROSCOPIC (NOT AT  [facility]) - Abnormal; Notable for the following:    Hgb urine dipstick MODERATE (*)    Ketones, ur 15 (*)    Leukocytes, UA MODERATE (*)    All other components within normal limits  URINE MICROSCOPIC-ADD ON - Abnormal; Notable for the following:    Bacteria, UA FEW (*)    All other components within normal limits   the patient is given IV fluids.  She is feeling some better.  She has tolerated oral fluids without difficulty and advised her she will need follow-up with her primary care doctor in GYN patient's laboratory testing, so that showed a significant abnormalities    Dalia Heading, PA-C 09/02/14 0106  Sherwood Gambler, MD 09/02/14 5056

## 2014-09-02 ENCOUNTER — Encounter (HOSPITAL_COMMUNITY): Payer: Self-pay

## 2014-09-02 ENCOUNTER — Emergency Department (HOSPITAL_COMMUNITY)
Admission: EM | Admit: 2014-09-02 | Discharge: 2014-09-03 | Disposition: A | Discharge status: Home / Self Care | Payer: PRIVATE HEALTH INSURANCE | Source: Home / Self Care | Attending: Emergency Medicine | Admitting: Emergency Medicine

## 2014-09-02 DIAGNOSIS — R112 Nausea with vomiting, unspecified: Secondary | ICD-10-CM | POA: Insufficient documentation

## 2014-09-02 DIAGNOSIS — I158 Other secondary hypertension: Secondary | ICD-10-CM | POA: Diagnosis present

## 2014-09-02 DIAGNOSIS — K2970 Gastritis, unspecified, without bleeding: Secondary | ICD-10-CM | POA: Diagnosis present

## 2014-09-02 DIAGNOSIS — O9089 Other complications of the puerperium, not elsewhere classified: Secondary | ICD-10-CM | POA: Diagnosis not present

## 2014-09-02 DIAGNOSIS — Z3202 Encounter for pregnancy test, result negative: Secondary | ICD-10-CM

## 2014-09-02 DIAGNOSIS — H9209 Otalgia, unspecified ear: Secondary | ICD-10-CM | POA: Diagnosis present

## 2014-09-02 DIAGNOSIS — R6883 Chills (without fever): Secondary | ICD-10-CM

## 2014-09-02 DIAGNOSIS — R51 Headache: Secondary | ICD-10-CM

## 2014-09-02 DIAGNOSIS — Z8249 Family history of ischemic heart disease and other diseases of the circulatory system: Secondary | ICD-10-CM

## 2014-09-02 DIAGNOSIS — R030 Elevated blood-pressure reading, without diagnosis of hypertension: Secondary | ICD-10-CM | POA: Diagnosis not present

## 2014-09-02 DIAGNOSIS — Z823 Family history of stroke: Secondary | ICD-10-CM

## 2014-09-02 DIAGNOSIS — Z833 Family history of diabetes mellitus: Secondary | ICD-10-CM

## 2014-09-02 NOTE — ED Notes (Signed)
Patient reports she has had N/V x 5 days.  Post-vaginal delivery 08/21/14.  Seen at [facility] 2 days ago for same.  Reports Zofran is not helping.  Followed up with OB, who recommended 24-hour liquid diet and prescribed Phenergan suppositories.  Patient states that in spite of these, her nausea and vomiting have progressed.

## 2014-09-03 ENCOUNTER — Other Ambulatory Visit: Payer: Self-pay | Admitting: Obstetrics and Gynecology

## 2014-09-03 ENCOUNTER — Encounter (HOSPITAL_COMMUNITY): Payer: Self-pay | Admitting: *Deleted

## 2014-09-03 ENCOUNTER — Inpatient Hospital Stay (HOSPITAL_COMMUNITY)
Admission: AD | Admit: 2014-09-03 | Discharge: 2014-09-05 | DRG: 776 | Disposition: A | Discharge status: Home / Self Care | Payer: PRIVATE HEALTH INSURANCE | Source: Ambulatory Visit | Attending: Obstetrics and Gynecology | Admitting: Obstetrics and Gynecology

## 2014-09-03 DIAGNOSIS — R030 Elevated blood-pressure reading, without diagnosis of hypertension: Secondary | ICD-10-CM | POA: Diagnosis present

## 2014-09-03 DIAGNOSIS — R112 Nausea with vomiting, unspecified: Secondary | ICD-10-CM | POA: Diagnosis present

## 2014-09-03 DIAGNOSIS — H9209 Otalgia, unspecified ear: Secondary | ICD-10-CM | POA: Diagnosis present

## 2014-09-03 DIAGNOSIS — K2970 Gastritis, unspecified, without bleeding: Secondary | ICD-10-CM | POA: Diagnosis present

## 2014-09-03 DIAGNOSIS — Z833 Family history of diabetes mellitus: Secondary | ICD-10-CM | POA: Diagnosis not present

## 2014-09-03 DIAGNOSIS — Z8249 Family history of ischemic heart disease and other diseases of the circulatory system: Secondary | ICD-10-CM | POA: Diagnosis not present

## 2014-09-03 DIAGNOSIS — I158 Other secondary hypertension: Secondary | ICD-10-CM | POA: Diagnosis present

## 2014-09-03 DIAGNOSIS — Z823 Family history of stroke: Secondary | ICD-10-CM | POA: Diagnosis not present

## 2014-09-03 DIAGNOSIS — O9089 Other complications of the puerperium, not elsewhere classified: Secondary | ICD-10-CM | POA: Diagnosis present

## 2014-09-03 DIAGNOSIS — O164 Unspecified maternal hypertension, complicating childbirth: Secondary | ICD-10-CM | POA: Diagnosis present

## 2014-09-03 DIAGNOSIS — I10 Essential (primary) hypertension: Secondary | ICD-10-CM | POA: Diagnosis present

## 2014-09-03 LAB — COMPREHENSIVE METABOLIC PANEL
ALK PHOS: 138 U/L — AB (ref 38–126)
ALT: 15 U/L (ref 14–54)
ALT: 16 U/L (ref 14–54)
ANION GAP: 11 (ref 5–15)
AST: 14 U/L — ABNORMAL LOW (ref 15–41)
AST: 20 U/L (ref 15–41)
Albumin: 3.8 g/dL (ref 3.5–5.0)
Albumin: 4.1 g/dL (ref 3.5–5.0)
Alkaline Phosphatase: 149 U/L — ABNORMAL HIGH (ref 38–126)
Anion gap: 9 (ref 5–15)
BUN: 9 mg/dL (ref 6–20)
BUN: 11 mg/dL (ref 6–20)
CALCIUM: 8.7 mg/dL — AB (ref 8.9–10.3)
CO2: 23 mmol/L (ref 22–32)
CO2: 25 mmol/L (ref 22–32)
Calcium: 9 mg/dL (ref 8.9–10.3)
Chloride: 102 mmol/L (ref 101–111)
Chloride: 99 mmol/L — ABNORMAL LOW (ref 101–111)
Creatinine, Ser: 0.77 mg/dL (ref 0.44–1.00)
Creatinine, Ser: 0.82 mg/dL (ref 0.44–1.00)
GFR calc Af Amer: >60 mL/min (ref >60)
GFR calc non Af Amer: >60 mL/min (ref >60)
GLUCOSE: 109 mg/dL — AB (ref 65–99)
Glucose, Bld: 94 mg/dL (ref 65–99)
POTASSIUM: 3.9 mmol/L (ref 3.5–5.1)
Potassium: 3.6 mmol/L (ref 3.5–5.1)
Sodium: 134 mmol/L — ABNORMAL LOW (ref 135–145)
Sodium: 135 mmol/L (ref 135–145)
TOTAL PROTEIN: 7.8 g/dL (ref 6.5–8.1)
Total Bilirubin: 1.3 mg/dL — ABNORMAL HIGH (ref 0.3–1.2)
Total Bilirubin: 1.8 mg/dL — ABNORMAL HIGH (ref 0.3–1.2)
Total Protein: 7.1 g/dL (ref 6.5–8.1)

## 2014-09-03 LAB — URINALYSIS W MICROSCOPIC (NOT AT ARMC)
Glucose, UA: NEGATIVE mg/dL
Ketones, ur: 15 mg/dL — AB
Leukocytes, UA: NEGATIVE
Nitrite: NEGATIVE
Protein, ur: NEGATIVE mg/dL
Specific Gravity, Urine: 1.025 (ref 1.005–1.030)
UROBILINOGEN UA: 2 mg/dL — AB (ref 0.0–1.0)
pH: 6 (ref 5.0–8.0)

## 2014-09-03 LAB — URINALYSIS, ROUTINE W REFLEX MICROSCOPIC
Glucose, UA: NEGATIVE mg/dL
Ketones, ur: >80 mg/dL — AB
Nitrite: NEGATIVE
PH: 6 (ref 5.0–8.0)
PROTEIN: 30 mg/dL — AB
Specific Gravity, Urine: 1.028 (ref 1.005–1.030)
UROBILINOGEN UA: 1 mg/dL (ref 0.0–1.0)

## 2014-09-03 LAB — URINE MICROSCOPIC-ADD ON

## 2014-09-03 LAB — LIPASE, BLOOD: Lipase: 39 U/L (ref 22–51)

## 2014-09-03 LAB — CBC
HCT: 45.2 % (ref 36.0–46.0)
HCT: 46.4 % — ABNORMAL HIGH (ref 36.0–46.0)
HEMOGLOBIN: 15.9 g/dL — AB (ref 12.0–15.0)
HEMOGLOBIN: 16.4 g/dL — AB (ref 12.0–15.0)
MCH: 32.6 pg (ref 26.0–34.0)
MCH: 32.8 pg (ref 26.0–34.0)
MCHC: 35.2 g/dL (ref 30.0–36.0)
MCHC: 35.3 g/dL (ref 30.0–36.0)
MCV: 92.6 fL (ref 78.0–100.0)
MCV: 92.8 fL (ref 78.0–100.0)
PLATELETS: 520 10*3/uL — AB (ref 150–400)
Platelets: 476 10*3/uL — ABNORMAL HIGH (ref 150–400)
RBC: 4.88 MIL/uL (ref 3.87–5.11)
RBC: 5 MIL/uL (ref 3.87–5.11)
RDW: 13.4 % (ref 11.5–15.5)
RDW: 13.5 % (ref 11.5–15.5)
WBC: 10.4 10*3/uL (ref 4.0–10.5)
WBC: 11.4 10*3/uL — ABNORMAL HIGH (ref 4.0–10.5)

## 2014-09-03 LAB — LACTATE DEHYDROGENASE: LDH: 163 U/L (ref 98–192)

## 2014-09-03 LAB — PROTEIN / CREATININE RATIO, URINE
CREATININE, URINE: 176 mg/dL
PROTEIN CREATININE RATIO: 0.15 mg/mg{creat} (ref 0.00–0.15)
TOTAL PROTEIN, URINE: 26 mg/dL

## 2014-09-03 LAB — URIC ACID: URIC ACID, SERUM: 5.8 mg/dL (ref 2.3–6.6)

## 2014-09-03 LAB — MRSA PCR SCREENING: MRSA BY PCR: NEGATIVE

## 2014-09-03 LAB — PREGNANCY, URINE: Preg Test, Ur: NEGATIVE

## 2014-09-03 MED ORDER — SODIUM CHLORIDE 0.9 % IV BOLUS (SEPSIS)
1000.0000 mL | Freq: Once | INTRAVENOUS | Status: AC
  Administered 2014-09-03: 1000 mL via INTRAVENOUS

## 2014-09-03 MED ORDER — MAGNESIUM SULFATE BOLUS VIA INFUSION
4.0000 g | Freq: Once | INTRAVENOUS | Status: AC
  Administered 2014-09-03: 4 g via INTRAVENOUS
  Filled 2014-09-03: qty 500

## 2014-09-03 MED ORDER — ZOLPIDEM TARTRATE 5 MG PO TABS: 5.0000 mg | ORAL_TABLET | Freq: Every evening | ORAL | Status: DC | PRN

## 2014-09-03 MED ORDER — METOCLOPRAMIDE HCL 5 MG/ML IJ SOLN
10.0000 mg | Freq: Four times a day (QID) | INTRAMUSCULAR | Status: DC
  Administered 2014-09-03 – 2014-09-04 (×2): 10 mg via INTRAVENOUS
  Filled 2014-09-03 (×2): qty 2

## 2014-09-03 MED ORDER — LABETALOL HCL 5 MG/ML IV SOLN
INTRAVENOUS | Status: AC
  Administered 2014-09-03: 20 mg via INTRAVENOUS
  Filled 2014-09-03: qty 4

## 2014-09-03 MED ORDER — LABETALOL HCL 5 MG/ML IV SOLN
20.0000 mg | INTRAVENOUS | Status: DC | PRN
  Administered 2014-09-03: 20 mg via INTRAVENOUS
  Administered 2014-09-03: 40 mg via INTRAVENOUS
  Filled 2014-09-03: qty 8

## 2014-09-03 MED ORDER — LACTATED RINGERS IV SOLN
INTRAVENOUS | Status: DC
  Administered 2014-09-03 – 2014-09-04 (×3): via INTRAVENOUS

## 2014-09-03 MED ORDER — ACETAMINOPHEN 325 MG PO TABS
650.0000 mg | ORAL_TABLET | ORAL | Status: DC | PRN
  Administered 2014-09-04: 650 mg via ORAL
  Filled 2014-09-03: qty 2

## 2014-09-03 MED ORDER — PANTOPRAZOLE SODIUM 40 MG IV SOLR
40.0000 mg | INTRAVENOUS | Status: AC
  Administered 2014-09-03: 40 mg via INTRAVENOUS
  Filled 2014-09-03: qty 40

## 2014-09-03 MED ORDER — ONDANSETRON HCL 4 MG/2ML IJ SOLN
4.0000 mg | Freq: Once | INTRAMUSCULAR | Status: AC
  Administered 2014-09-03: 4 mg via INTRAVENOUS
  Filled 2014-09-03: qty 2

## 2014-09-03 MED ORDER — LACTATED RINGERS IV BOLUS (SEPSIS)
500.0000 mL | Freq: Once | INTRAVENOUS | Status: AC
  Administered 2014-09-03: 500 mL via INTRAVENOUS

## 2014-09-03 MED ORDER — BISACODYL 10 MG RE SUPP
10.0000 mg | Freq: Every day | RECTAL | Status: DC | PRN
  Administered 2014-09-04: 10 mg via RECTAL
  Filled 2014-09-03 (×2): qty 1

## 2014-09-03 MED ORDER — HYDRALAZINE HCL 20 MG/ML IJ SOLN: 10.0000 mg | Freq: Once | INTRAMUSCULAR | Status: AC | PRN

## 2014-09-03 MED ORDER — PROMETHAZINE HCL 25 MG/ML IJ SOLN: 25.0000 mg | Freq: Four times a day (QID) | INTRAMUSCULAR | Status: DC | PRN

## 2014-09-03 MED ORDER — LORAZEPAM 2 MG/ML IJ SOLN
1.0000 mg | Freq: Once | INTRAMUSCULAR | Status: AC
  Administered 2014-09-03: 1 mg via INTRAVENOUS
  Filled 2014-09-03: qty 1

## 2014-09-03 MED ORDER — LORAZEPAM 1 MG PO TABS: 1.0000 mg | ORAL_TABLET | Freq: Three times a day (TID) | ORAL | Status: DC | PRN

## 2014-09-03 MED ORDER — MAGNESIUM SULFATE 50 % IJ SOLN
2.0000 g/h | INTRAVENOUS | Status: AC
  Administered 2014-09-03 – 2014-09-04 (×2): 2 g/h via INTRAVENOUS
  Filled 2014-09-03 (×2): qty 80

## 2014-09-03 MED ORDER — ONDANSETRON HCL 4 MG/2ML IJ SOLN
4.0000 mg | Freq: Three times a day (TID) | INTRAMUSCULAR | Status: DC | PRN
  Administered 2014-09-03: 4 mg via INTRAVENOUS
  Filled 2014-09-03: qty 2

## 2014-09-03 NOTE — H&P (Signed)
Brooke Dickerson is a 34 y.o. female, G2P1001, s/p SVB on 08/21/14 weeks, presenting today after [employer] RN BP check showed BP of 140/90, with mild HA, and c/o of N/V x 1 week.  Patient has had several evaluations over the last few days as detailed below.  Still reports inability to keep down any food or fluids, reports mild HA. Denies abdominal pain or diarrhea--reports no BM in approx 1 week, is passing flatus. No known viral exposures.  08/27/14--seen [facility] with pain in left ear.  Dx with cerumen.  BP 112/68. 08/31/14--seen at [facility] for N/V, weakness.  BP 167/94, AST/ALT WNL, platelets 459, Hgb 14.8, negative urine protein. Rx'd Zofran 09/01/14--seen at [facility] for N/V.  BP 130/100.  Amylase/lipase normal.  Rx'd Phenergan 09/02/14 into early 09/03/14--seen at [facility] for persistent N/V, unresponsive to Zofran, mild HAs, weight loss.  Hgb 16.4, Plat 520, > 80 ketones, 30 protein.  Received IV fluids. Today:  Seen by [employer] RN, with elevated BP noted.  Office notified, and I requested patient come to MAU for further evaluation.  Patient is pumping for breastmilk--baby is currently with patient's mother.  She has had any intake today.  Last meds for nausea were given in ER early am.  Patient Active Problem List   Diagnosis Date Noted  . Hypertension 09/03/2014  . Nausea and vomiting 09/03/2014  . Hypertension complicating pregnancy, delivered-current hospitalization 09/03/2014  . Vaginal delivery 08/21/2014  . 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 Rh negative state in antepartum period 08/20/2014  . Vaginal bleeding 08/20/2014  . VULVAR ABSCESS 07/08/2009    History of present pregnancy: Patient entered care at 10 1/7 weeks. Transferred from [facility] office in [place]. EDC of 08/25/14 was established by LMP.  Anatomy scan: 19 2/7 weeks, with limited anatomy and an posterior placenta.  Additional [place] evaluations:  22 2/7 weeks: Completion of anatomy except ductal  arch.  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 On Valtrex for suppression since December for treatment of outbreak, then suppression. Had vagal episode at 36 weeks with exam, recovered without difficulty.  BP 110/80 on 08/13/14. Delivery 08/21/14--SVB by Farrel Gordon, CNM.  Admitting BP 125/74 Had isolated elevations of BP during labor.  PCR 0.49, but BPs were overall normal.  Per consult with Dr. Alesia Richards, no treatment was initiated. BPs were overall WNL for the rest of her stay.    Pregnancy Hx: 2014--TAB at 7 weeks 08/21/14--SVB at term.  History reviewed. No pertinent past medical history.  Past Surgical History  Procedure Laterality Date  . No past surgeries    TAB 2014  Family History: family history includes Cancer in her father; Heart failure in her mother; Hypertension in her mother. Emphysema in mother, thyroid issues in mother, CVA in mother, diabetes in mother.  Social History:  reports that she has never smoked. She does not have any smokeless tobacco history on file. She reports that she does not drink alcohol or use illicit drugs. Patient is African American, of the [place], married to [employer], who is involved and supportive. Patient is post-graduate educated, employed as Education officer, museum.  TDAP 05/20/14 Flu 10/2013  ROS: Mild HA, nausea, no current vomiting.  No Known Allergies  Filed Vitals:   09/03/14 1632 09/03/14 1647 09/03/14 1702 09/03/14 1705  BP: 183/103 188/95 168/94 169/90  Pulse: 52 54 61 67  Temp:      TempSrc:      Resp:  Range since arrival in MAU:  148-188/90-119  Received IV labetalol 20 mg IV at 1653 and 40 mg IV agt 1719. BP post 2nd labetalol dose 140/94.  Physical Exam: Chest clear Heart RRR without murmur Abd soft, NT, mild gaseous distension, + bowel sounds Uterus--approx 12-14 week size, NT. Pelvic--minimal lochia Ext--tr/1+ edema, DTR 1+, 1 beat clonus on left.  Results for  orders placed or performed during the hospital encounter of 09/03/14 (from the past 24 hour(s))  Protein / creatinine ratio, urine     Status: None   Collection Time: 09/03/14  2:55 PM  Result Value Ref Range   Creatinine, Urine 176.00 mg/dL   Total Protein, Urine 26 mg/dL   Protein Creatinine Ratio 0.15 0.00 - 0.15 mg/mg[Cre]  Urinalysis with microscopic     Status: Abnormal   Collection Time: 09/03/14  2:55 PM  Result Value Ref Range   Color, Urine AMBER (A) YELLOW   APPearance CLEAR CLEAR   Specific Gravity, Urine 1.025 1.005 - 1.030   pH 6.0 5.0 - 8.0   Glucose, UA NEGATIVE NEGATIVE mg/dL   Hgb urine dipstick TRACE (A) NEGATIVE   Bilirubin Urine SMALL (A) NEGATIVE   Ketones, ur 15 (A) NEGATIVE mg/dL   Protein, ur NEGATIVE NEGATIVE mg/dL   Urobilinogen, UA 2.0 (H) 0.0 - 1.0 mg/dL   Nitrite NEGATIVE NEGATIVE   Leukocytes, UA NEGATIVE NEGATIVE   WBC, UA 3-6 <3 WBC/hpf   RBC / HPF 0-2 <3 RBC/hpf   Bacteria, UA MANY (A) RARE   Squamous Epithelial / LPF RARE RARE   Urine-Other MUCOUS PRESENT   CBC     Status: Abnormal   Collection Time: 09/03/14  3:41 PM  Result Value Ref Range   WBC 10.4 4.0 - 10.5 K/uL   RBC 4.88 3.87 - 5.11 MIL/uL   Hemoglobin 15.9 (H) 12.0 - 15.0 g/dL   HCT 45.2 36.0 - 46.0 %   MCV 92.6 78.0 - 100.0 fL   MCH 32.6 26.0 - 34.0 pg   MCHC 35.2 30.0 - 36.0 g/dL   RDW 13.5 11.5 - 15.5 %   Platelets 476 (H) 150 - 400 K/uL  Comprehensive metabolic panel     Status: Abnormal   Collection Time: 09/03/14  3:41 PM  Result Value Ref Range   Sodium 134 (L) 135 - 145 mmol/L   Potassium 3.6 3.5 - 5.1 mmol/L   Chloride 102 101 - 111 mmol/L   CO2 23 22 - 32 mmol/L   Glucose, Bld 94 65 - 99 mg/dL   BUN 9 6 - 20 mg/dL   Creatinine, Ser 0.82 0.44 - 1.00 mg/dL   Calcium 8.7 (L) 8.9 - 10.3 mg/dL   Total Protein 7.1 6.5 - 8.1 g/dL   Albumin 3.8 3.5 - 5.0 g/dL   AST 14 (L) 15 - 41 U/L   ALT 15 14 - 54 U/L   Alkaline Phosphatase 138 (H) 38 - 126 U/L   Total Bilirubin  1.3 (H) 0.3 - 1.2 mg/dL   GFR calc non Af Amer >60 >60 mL/min   GFR calc Af Amer >60 >60 mL/min   Anion gap 9 5 - 15  Lactate dehydrogenase     Status: None   Collection Time: 09/03/14  3:41 PM  Result Value Ref Range   LDH 163 98 - 192 U/L  Uric acid     Status: None   Collection Time: 09/03/14  3:41 PM  Result Value Ref Range   Uric Acid, Serum 5.8  2.3 - 6.6 mg/dL    Prenatal labs: ABO, Rh: --/--/AB NEG (07/01 0840)AB neg Antibody: PENDING (07/01 0840)Neg Rubella:  Immune RPR:   NR HBsAg:   Neg HIV:  NR  GBS: Positive (07/01 0000) Sickle cell/Hgb electrophoresis: AA Pap: Unknown GC: Negative 12/30/13 and 07/30/14 Chlamydia: Negative 12/30/13 and 07/30/14 Genetic screenings: Normal 1st trimester screen and AFP Glucola: Elevated 1 hour at 149, normal 3 hour Other:  Hgb 14.0 at NOB, 12.4 at 28 weeks Urine culture 100K Pseudomonas [place] 12/2013, negative TOC 01/28/14 Varicella immune   Assessment/Plan: S/P SVB 7/2 Mild HA Likely pre-eclampsia Normal PCR and PIH labs N/V Constipation x 1 week  Plan: Admit to AICU for magnesium sulfate therapy x 24 hours IV labetalol for BP parameters Repeat PIH labs in AM Reglan ATC for gastric motility Zofran/Phenergan for prn anti-emetics NPO except ice chips at present.  Donnel Saxon, [place], [place] 09/03/14 6p

## 2014-09-03 NOTE — ED Notes (Signed)
Patient given gingerale for PO challenge. Will con't to monitor.

## 2014-09-03 NOTE — Discharge Instructions (Signed)
Take ativan as needed for nausea. Pump and dump your breast milk after taking this medication. Follow up with [facility]. Refer to attached documents for more information.

## 2014-09-03 NOTE — ED Notes (Signed)
Patient c/o nausea and vomiting, onset after delivery of her baby @ 2 weeks ago. Patient seen at [facility] on Tuesday, given rx for Zofran which did not help at home. Patient spoke to her OBGYN on Wednesday and was told to have a liquid diet x24 hours and was given phenergan suppositories. Patient states she has taken the suppositories q7 hours and has not had relief. Patient reports brief period of relief following liquid diet, states as soon as she reintroduced solids she began to vomit again. Patient states she is currently unable to tolerate any POs. Patient states she is now starting to feel dizzy and lightheaded related to the emesis. Patient denies pain. Family at bedside.

## 2014-09-03 NOTE — MAU Note (Signed)
Urine in lab 

## 2014-09-03 NOTE — ED Provider Notes (Signed)
CSN: [id number]     Arrival date & time 09/02/14  2230 History   First MD Initiated Contact with Patient 09/03/14 0030     Chief Complaint  Patient presents with  . Emesis     (Consider location/radiation/quality/duration/timing/severity/associated sxs/prior Treatment) HPI Comments: Brooke Dickerson, 34 y/o female presents to the ED with persistent nausea and vomiting. [facility] 3 days ago and went to her OB/gyn yesterday for nausea. She has not eaten solid foods since Monday. Now she is getting nauseas even with fluids. She has tried zofran without relief. She is having associated mild headaches, dizziness, night sweats and chills, and fatigue. Her last BM was 4 days ago. She says she has lost 8 pounds in the last week. She had a baby two weeks ago and is breast feeding. She denies myalgias, fevers, changes in urine frequency, hematuria, diarrhea, or blood in vomit.  Patient is a 34 y.o. female presenting with vomiting. The history is provided by the patient.  Emesis Severity:  Moderate Duration:  4 days Timing:  Constant Progression:  Worsening Recent urination:  Normal Relieved by:  Nothing Associated symptoms: chills and headaches   Associated symptoms: no abdominal pain, no diarrhea, no fever and no myalgias     History reviewed. No pertinent past medical history. Past Surgical History  Procedure Laterality Date  . No past surgeries     Family History  Problem Relation Age of Onset  . Heart failure Mother   . Hypertension Mother   . Cancer Father    History  Substance Use Topics  . Smoking status: Never Smoker   . Smokeless tobacco: Not on file  . Alcohol Use: No     Comment: 1 a week   OB History    Gravida Para Term Preterm AB TAB SAB Ectopic Multiple Living   2 1 1  1 1    0 1     Review of Systems  Constitutional: Positive for chills.  Gastrointestinal: Positive for vomiting. Negative for abdominal pain and diarrhea.  Musculoskeletal:  Negative for myalgias.  Neurological: Positive for headaches.  All other systems reviewed and are negative.     Allergies  Review of patient's allergies indicates no known allergies.  Home Medications   Prior to Admission medications   Medication Sig Start Date End Date Taking? Authorizing Provider  ondansetron (ZOFRAN) 4 MG tablet Take 1 tablet (4 mg total) by mouth every 6 (six) hours. 08/31/14  Yes Christopher Lawyer, PA-C  promethazine (PHENERGAN) 25 MG suppository Place 1 suppository rectally every 8 (eight) hours as needed for nausea.  09/01/14  Yes Historical Provider, MD  ibuprofen (ADVIL,MOTRIN) 600 MG tablet Take 1 tablet (600 mg total) by mouth every 6 (six) hours. Patient not taking: Reported on 08/27/2014 08/23/14   Venus Standard, CNM  oxyCODONE-acetaminophen (PERCOCET/ROXICET) 5-325 MG per tablet Take 1 tablet by mouth every 4 (four) hours as needed (for pain scale 4-7). Patient not taking: Reported on 09/02/2014 08/23/14   Venus Standard, CNM   BP 144/90 mmHg  Pulse 62  Temp(Src) 98.2 F (36.8 C) (Oral)  Resp 18  Ht 5\' 2"  (1.575 m)  Wt 146 lb (66.225 kg)  BMI 26.70 kg/m2  SpO2 100%  LMP 08/20/2013  Breastfeeding? Yes Physical Exam  Constitutional: She is oriented to person, place, and time. She appears well-developed and well-nourished. No distress.  HENT:  Head: Normocephalic and atraumatic.  Eyes: Conjunctivae and EOM are normal.  Neck: Normal range of motion.  Neck supple.  Cardiovascular: Normal rate and regular rhythm.  Exam reveals no gallop and no friction rub.   No murmur heard. Pulmonary/Chest: Effort normal and breath sounds normal. She has no wheezes. She has no rales. She exhibits no tenderness.  Abdominal: Soft. Bowel sounds are normal. She exhibits no distension and no mass. There is no tenderness.  Musculoskeletal: Normal range of motion.  Neurological: She is alert and oriented to person, place, and time.  Speech is goal-oriented. Moves limbs without  ataxia.   Skin: Skin is warm and dry.  Psychiatric: She has a normal mood and affect.  Nursing note and vitals reviewed.   ED Course  Procedures (including critical care time) Labs Review Labs Reviewed  COMPREHENSIVE METABOLIC PANEL - Abnormal; Notable for the following:    Chloride 99 (*)    Glucose, Bld 109 (*)    Alkaline Phosphatase 149 (*)    Total Bilirubin 1.8 (*)    All other components within normal limits  CBC - Abnormal; Notable for the following:    WBC 11.4 (*)    Hemoglobin 16.4 (*)    HCT 46.4 (*)    Platelets 520 (*)    All other components within normal limits  URINALYSIS, ROUTINE W REFLEX MICROSCOPIC (NOT AT [facility]) - Abnormal; Notable for the following:    Color, Urine AMBER (*)    APPearance CLOUDY (*)    Hgb urine dipstick MODERATE (*)    Bilirubin Urine SMALL (*)    Ketones, ur >80 (*)    Protein, ur 30 (*)    Leukocytes, UA MODERATE (*)    All other components within normal limits  URINE MICROSCOPIC-ADD ON - Abnormal; Notable for the following:    Bacteria, UA FEW (*)    All other components within normal limits  LIPASE, BLOOD  PREGNANCY, URINE    Imaging Review No results found.   EKG Interpretation None      MDM   Final diagnoses:  Non-intractable vomiting with nausea, vomiting of unspecified type    Labs unremarkable for acute changes. Patient reports improvement of nausea with ativan. Patient instructed to follow up with her OBGYN and recommended GI doctor. Vitals stable and patient afebrile. Patient able to tolerate PO.     Alvina Chou, PA-C 09/04/14 0302  April Palumbo, MD 09/07/14 2321

## 2014-09-03 NOTE — MAU Note (Signed)
Home health measured BP today, elevated. [facility] sent to MAU for BP monitoring. Pt has a headache for past 24 hours. Positive N/V. Swelling in legs since last week. Light headed for past 24 hours. Feels like her temp is fluctuating hot/cold. Denies blurred vision.

## 2014-09-04 LAB — COMPREHENSIVE METABOLIC PANEL
ALK PHOS: 132 U/L — AB (ref 38–126)
ALT: 13 U/L — ABNORMAL LOW (ref 14–54)
ANION GAP: 7 (ref 5–15)
AST: 12 U/L — ABNORMAL LOW (ref 15–41)
Albumin: 3.6 g/dL (ref 3.5–5.0)
BUN: 7 mg/dL (ref 6–20)
CALCIUM: 7.2 mg/dL — AB (ref 8.9–10.3)
CO2: 27 mmol/L (ref 22–32)
Chloride: 104 mmol/L (ref 101–111)
Creatinine, Ser: 0.7 mg/dL (ref 0.44–1.00)
GFR calc Af Amer: >60 mL/min (ref >60)
GFR calc non Af Amer: >60 mL/min (ref >60)
Glucose, Bld: 114 mg/dL — ABNORMAL HIGH (ref 65–99)
POTASSIUM: 3.2 mmol/L — AB (ref 3.5–5.1)
SODIUM: 138 mmol/L (ref 135–145)
Total Bilirubin: 1.1 mg/dL (ref 0.3–1.2)
Total Protein: 6.2 g/dL — ABNORMAL LOW (ref 6.5–8.1)

## 2014-09-04 LAB — CBC
HEMATOCRIT: 43.5 % (ref 36.0–46.0)
HEMOGLOBIN: 15.4 g/dL — AB (ref 12.0–15.0)
MCH: 33 pg (ref 26.0–34.0)
MCHC: 35.4 g/dL (ref 30.0–36.0)
MCV: 93.3 fL (ref 78.0–100.0)
PLATELETS: 407 10*3/uL — AB (ref 150–400)
RBC: 4.66 MIL/uL (ref 3.87–5.11)
RDW: 13.8 % (ref 11.5–15.5)
WBC: 11.3 10*3/uL — AB (ref 4.0–10.5)

## 2014-09-04 LAB — MAGNESIUM: MAGNESIUM: 5.3 mg/dL — AB (ref 1.7–2.4)

## 2014-09-04 LAB — URIC ACID: Uric Acid, Serum: 5.6 mg/dL (ref 2.3–6.6)

## 2014-09-04 MED ORDER — METOCLOPRAMIDE HCL 5 MG/ML IJ SOLN: 10.0000 mg | Freq: Four times a day (QID) | INTRAMUSCULAR | Status: DC | PRN

## 2014-09-04 NOTE — Plan of Care (Signed)
Problem: Phase II Progression Outcomes Goal: Progress activity as tolerated unless otherwise ordered Outcome: Completed/Met Date Met:  09/04/14 Bedrest, pt has no physical limitations

## 2014-09-04 NOTE — Progress Notes (Signed)
Readmit for postpartum preeclampsia, HD#2  S:  Patient resting comfortable in bed.  Pt reports slight HA this am 3/10 that has now resolved s/p tylenol.  No F/C/CP/SOB.  No blurry vision, no RUQ pain.  Of note, she is still having ear pain- which she was told was cerumen build up and to use OTC ear drops and irrigation.  No nausea or vomiting, tolerating CLD without complications.  Of note, she has not had a BM since Monday though she has not eaten any food since that time.  O: Temp:  [97.6 F (36.4 C)-98.5 F (36.9 C)] 97.6 F (36.4 C) (07/16 0350) Pulse Rate:  [52-162] 85 (07/16 0600) Resp:  [16-18] 18 (07/16 0350) BP: (109-188)/(61-119) 123/80 mmHg (07/16 0600) SpO2:  [89 %-100 %] 96 % (07/16 0600) Weight:  [66.18 kg (145 lb 14.4 oz)] 66.18 kg (145 lb 14.4 oz) (07/15 1857)   Gen: A&Ox3, NAD CV: RRR, no MRG Resp: CTAB Abdomen: soft, NT, ND BS quiet Uterus: firm, non-tender, below umbilicus Ext: No edema, no calf tenderness bilaterally, 1+ DTRs, no clonus  Labs:  Results for orders placed or performed during the hospital encounter of 09/03/14 (from the past 24 hour(s))  Protein / creatinine ratio, urine     Status: None   Collection Time: 09/03/14  2:55 PM  Result Value Ref Range   Creatinine, Urine 176.00 mg/dL   Total Protein, Urine 26 mg/dL   Protein Creatinine Ratio 0.15 0.00 - 0.15 mg/mg[Cre]  Urinalysis with microscopic     Status: Abnormal   Collection Time: 09/03/14  2:55 PM  Result Value Ref Range   Color, Urine AMBER (A) YELLOW   APPearance CLEAR CLEAR   Specific Gravity, Urine 1.025 1.005 - 1.030   pH 6.0 5.0 - 8.0   Glucose, UA NEGATIVE NEGATIVE mg/dL   Hgb urine dipstick TRACE (A) NEGATIVE   Bilirubin Urine SMALL (A) NEGATIVE   Ketones, ur 15 (A) NEGATIVE mg/dL   Protein, ur NEGATIVE NEGATIVE mg/dL   Urobilinogen, UA 2.0 (H) 0.0 - 1.0 mg/dL   Nitrite NEGATIVE NEGATIVE   Leukocytes, UA NEGATIVE NEGATIVE   WBC, UA 3-6 <3 WBC/hpf   RBC / HPF 0-2 <3 RBC/hpf   Bacteria, UA MANY (A) RARE   Squamous Epithelial / LPF RARE RARE   Urine-Other MUCOUS PRESENT   CBC     Status: Abnormal   Collection Time: 09/03/14  3:41 PM  Result Value Ref Range   WBC 10.4 4.0 - 10.5 K/uL   RBC 4.88 3.87 - 5.11 MIL/uL   Hemoglobin 15.9 (H) 12.0 - 15.0 g/dL   HCT 45.2 36.0 - 46.0 %   MCV 92.6 78.0 - 100.0 fL   MCH 32.6 26.0 - 34.0 pg   MCHC 35.2 30.0 - 36.0 g/dL   RDW 13.5 11.5 - 15.5 %   Platelets 476 (H) 150 - 400 K/uL  Comprehensive metabolic panel     Status: Abnormal   Collection Time: 09/03/14  3:41 PM  Result Value Ref Range   Sodium 134 (L) 135 - 145 mmol/L   Potassium 3.6 3.5 - 5.1 mmol/L   Chloride 102 101 - 111 mmol/L   CO2 23 22 - 32 mmol/L   Glucose, Bld 94 65 - 99 mg/dL   BUN 9 6 - 20 mg/dL   Creatinine, Ser 0.82 0.44 - 1.00 mg/dL   Calcium 8.7 (L) 8.9 - 10.3 mg/dL   Total Protein 7.1 6.5 - 8.1 g/dL   Albumin 3.8 3.5 -  5.0 g/dL   AST 14 (L) 15 - 41 U/L   ALT 15 14 - 54 U/L   Alkaline Phosphatase 138 (H) 38 - 126 U/L   Total Bilirubin 1.3 (H) 0.3 - 1.2 mg/dL   GFR calc non Af Amer >60 >60 mL/min   GFR calc Af Amer >60 >60 mL/min   Anion gap 9 5 - 15  Lactate dehydrogenase     Status: None   Collection Time: 09/03/14  3:41 PM  Result Value Ref Range   LDH 163 98 - 192 U/L  Uric acid     Status: None   Collection Time: 09/03/14  3:41 PM  Result Value Ref Range   Uric Acid, Serum 5.8 2.3 - 6.6 mg/dL  MRSA PCR Screening     Status: None   Collection Time: 09/03/14  7:10 PM  Result Value Ref Range   MRSA by PCR NEGATIVE NEGATIVE  CBC     Status: Abnormal   Collection Time: 09/04/14  5:32 AM  Result Value Ref Range   WBC 11.3 (H) 4.0 - 10.5 K/uL   RBC 4.66 3.87 - 5.11 MIL/uL   Hemoglobin 15.4 (H) 12.0 - 15.0 g/dL   HCT 43.5 36.0 - 46.0 %   MCV 93.3 78.0 - 100.0 fL   MCH 33.0 26.0 - 34.0 pg   MCHC 35.4 30.0 - 36.0 g/dL   RDW 13.8 11.5 - 15.5 %   Platelets 407 (H) 150 - 400 K/uL  Comprehensive metabolic panel     Status: Abnormal    Collection Time: 09/04/14  5:32 AM  Result Value Ref Range   Sodium 138 135 - 145 mmol/L   Potassium 3.2 (L) 3.5 - 5.1 mmol/L   Chloride 104 101 - 111 mmol/L   CO2 27 22 - 32 mmol/L   Glucose, Bld 114 (H) 65 - 99 mg/dL   BUN 7 6 - 20 mg/dL   Creatinine, Ser 0.70 0.44 - 1.00 mg/dL   Calcium 7.2 (L) 8.9 - 10.3 mg/dL   Total Protein 6.2 (L) 6.5 - 8.1 g/dL   Albumin 3.6 3.5 - 5.0 g/dL   AST 12 (L) 15 - 41 U/L   ALT 13 (L) 14 - 54 U/L   Alkaline Phosphatase 132 (H) 38 - 126 U/L   Total Bilirubin 1.1 0.3 - 1.2 mg/dL   GFR calc non Af Amer >60 >60 mL/min   GFR calc Af Amer >60 >60 mL/min   Anion gap 7 5 - 15  Uric acid     Status: None   Collection Time: 09/04/14  5:32 AM  Result Value Ref Range   Uric Acid, Serum 5.6 2.3 - 6.6 mg/dL  Magnesium     Status: Abnormal   Collection Time: 09/04/14  5:32 AM  Result Value Ref Range   Magnesium 5.3 (H) 1.7 - 2.4 mg/dL   A/P: Pt is a 34 y.o. G2P1011 s/p NSVD (08/21/14) admitted for postpartum preeclampsia and gastritis, HD#2  1)Preeclampsia -Currently on IV Magnesium until 1730 this evening, BP overall stable, no IV or oral medications at this time (s/p IV Labetalol on arrival) -Labs stable as above, adequate UOP, will continue to closely monitor -Headache resolved with tylenol, otherwise asymptomatic  2)Gastritis -Plan to advance to general diet today.   -Change all anti-emetics to prn  3) Ear pain -discuss with pharmacy, pt to have family member bring irrigation supplies    DISPO: Continue with AICU management, once off Magnesium this evening would consider transferring  to Med/surg floor  Janyth Pupa, DO [phone] (pager) [phone] (office)

## 2014-09-05 NOTE — Progress Notes (Signed)
Left message with [facility] nurse concerning MD order for follow up.

## 2014-09-05 NOTE — Discharge Instructions (Signed)

## 2014-09-05 NOTE — Progress Notes (Signed)
Readmit for postpartum preeclampsia, HD#3  S:  Patient resting comfortable in bed, no acute complaints and feeling overall better today.  No headache, blurry vision or RUQ pain.  No F/C/CP/SOB.  No nausea or vomiting, tolerating general diet.  Pt had BM s/p suppository.    O: Temp:  [98.1 F (36.7 C)-99.3 F (37.4 C)] 98.1 F (36.7 C) (07/17 0634) Pulse Rate:  [92-117] 92 (07/17 0634) Resp:  [16-20] 18 (07/16 2300) BP: (113-147)/(42-94) 114/63 mmHg (07/17 0634) SpO2:  [97 %-100 %] 97 % (07/17 0634)   Gen: A&Ox3, NAD CV: RRR, no MRG Resp: CTAB Abdomen: soft, NT, ND, no rebound, no guarding Uterus: firm, non-tender, below umbilicus Ext: No edema, no calf tenderness bilaterally, 1+ DTRs, no clonus  A/P: Pt is a 34 y.o. G2P1011 s/p NSVD (08/21/14) admitted for postpartum preeclampsia and gastritis, HD#3  1)Preeclampsia -s/p Magnesium x 24hr, BP within normal limits, no medications indicated -Labs stable, adequate UOP, asymptomatic  2)Gastritis-resolved.  Tolerating general diet 3) Ear pain- OTC remedies including irrigation  DISPO: BP within normal limits and asymptomatic x 24hr, will plan for discharge home today with close outpatient follow up  [phone] (pager) [phone] (office)

## 2014-09-09 NOTE — Progress Notes (Signed)
Post discharge chart review completed.  

## 2014-10-07 NOTE — Discharge Summary (Signed)
Physician Discharge Summary  Patient ID: Brooke Dickerson MRN: [id number] DOB/AGE: [birth year] 34 y.o.  Admit date: 09/03/2014 Discharge date: 7/162016  Admission Diagnoses:  Preeclampsia, Gastritis  Discharge Diagnoses:  Active Problems:   Hypertension   Nausea and vomiting   Hypertension complicating pregnancy, delivered-current hospitalization   Discharged Condition: stable  Hospital Course: 76HY [id number] who was readmitted following delivery on 08/21/14 who presented after [facility] RN BP check showed BP of 140/90, with mild HA, and c/o of N/V x 1 week. Patient has had several evaluations over the last few days as detailed below. She was admitted due to Gastritis and concern for postpartum preeclampsia.  08/27/14--seen at Urgent Care with pain in left ear. Dx with cerumen. BP 112/68. 08/31/14--seen at [facility] for N/V, weakness. BP 167/94, AST/ALT WNL, platelets 459, Hgb 14.8, negative urine protein. Rx'd Zofran 09/01/14--seen at [facility] for N/V. BP 130/100. Amylase/lipase normal. Rx'd Phenergan 09/02/14 into early 09/03/14--seen at [facility] for persistent N/V, unresponsive to Zofran, mild HAs, weight loss. Hgb 16.4, Plat 520, > 80 ketones, 30 protein. Received IV fluids.  Hospital course including IV hydration, IV magnesium and management of her nausea.  She was slowly advanced back to regular diet as tolerated and BP improved s/p Magnesium.  Once her BP was within normal limits and she was tolerating general diet without IV anti-emetics, the patient was discharged home in stable condition.  Consults: None  Significant Diagnostic Studies: labs: Results for Brooke Dickerson (MRN [id number]) as of 10/07/2014 08:31  Ref. Range 09/04/2014 05:32  Sodium Latest Ref Range: 135-145 mmol/L 138  Potassium Latest Ref Range: 3.5-5.1 mmol/L 3.2 (L)  Chloride Latest Ref Range: 101-111 mmol/L 104  CO2 Latest Ref Range: 22-32 mmol/L 27  BUN Latest Ref Range: 6-20 mg/dL 7  Creatinine Latest Ref Range:  0.44-1.00 mg/dL 0.70  Calcium Latest Ref Range: 8.9-10.3 mg/dL 7.2 (L)  EGFR (Non-African Amer.) Latest Ref Range: >60 mL/min >60  EGFR (African American) Latest Ref Range: >60 mL/min >60  Glucose Latest Ref Range: 65-99 mg/dL 114 (H)  Anion gap Latest Ref Range: 5-15  7  Magnesium Latest Ref Range: 1.7-2.4 mg/dL 5.3 (H)  Alkaline Phosphatase Latest Ref Range: 38-126 U/L 132 (H)  Albumin Latest Ref Range: 3.5-5.0 g/dL 3.6  Uric Acid, Serum Latest Ref Range: 2.3-6.6 mg/dL 5.6  AST Latest Ref Range: 15-41 U/L 12 (L)  ALT Latest Ref Range: 14-54 U/L 13 (L)  Total Protein Latest Ref Range: 6.5-8.1 g/dL 6.2 (L)  Total Bilirubin Latest Ref Range: 0.3-1.2 mg/dL 1.1  WBC Latest Ref Range: 4.0-10.5 K/uL 11.3 (H)  RBC Latest Ref Range: 3.87-5.11 MIL/uL 4.66  Hemoglobin Latest Ref Range: 12.0-15.0 g/dL 15.4 (H)  HCT Latest Ref Range: 36.0-46.0 % 43.5  MCV Latest Ref Range: 78.0-100.0 fL 93.3  MCH Latest Ref Range: 26.0-34.0 pg 33.0  MCHC Latest Ref Range: 30.0-36.0 g/dL 35.4  RDW Latest Ref Range: 11.5-15.5 % 13.8  Platelets Latest Ref Range: 150-400 K/uL 407 (H)    Treatments: IV hydration and IV Magnesium, IV anti-emetics  Discharge Exam: Blood pressure 124/80, pulse 92, temperature 98.9 F (37.2 C), temperature source Oral, resp. rate 18, height '5\' 2"'  (1.575 m), weight 66.18 kg (145 lb 14.4 oz), last menstrual period 08/20/2013, SpO2 97 %, currently breastfeeding. Gen: A&Ox3, NAD CV: RRR, no MRG Resp: CTAB Abdomen: soft, NT, ND, no rebound, no guarding Uterus: firm, non-tender, below umbilicus Ext: No edema, no calf tenderness bilaterally, 1+ DTRs, no clonus  Disposition: 01-Home or Self Care  Medication List    STOP taking these medications        ondansetron 4 MG tablet  Commonly known as:  ZOFRAN     oxyCODONE-acetaminophen 5-325 MG per tablet  Commonly known as:  PERCOCET/ROXICET     promethazine 25 MG suppository  Commonly known as:  PHENERGAN      TAKE these  medications        ibuprofen 600 MG tablet  Commonly known as:  ADVIL,MOTRIN  Take 1 tablet (600 mg total) by mouth every 6 (six) hours.     LORazepam 1 MG tablet  Commonly known as:  ATIVAN  Take 1 tablet (1 mg total) by mouth 3 (three) times daily as needed for anxiety.           Follow-up Information    Follow up with [facility], MD In 1 week.   Specialty:  Obstetrics and Gynecology   Contact information:   [address] [place] [address] [phone]       Signed: Annalee Genta 10/07/2014, 8:28 AM

## 2016-01-23 ENCOUNTER — Other Ambulatory Visit: Payer: Self-pay | Admitting: Obstetrics and Gynecology

## 2016-01-23 DIAGNOSIS — E049 Nontoxic goiter, unspecified: Secondary | ICD-10-CM

## 2016-01-27 ENCOUNTER — Other Ambulatory Visit: Payer: No Typology Code available for payment source

## 2019-02-20 NOTE — L&D Delivery Note (Signed)
Call to [facility] CNM for orders for this patient. No orders found nor any orders in sign and held. She stated that she could see them but they are not visible to me. Anderson Malta then said "you can put the orders in for routine induction".  I told her that I did not feel comfortable putting in these orders.  Charge nurse Tandy Gaw notified by me of above conversation with CNM.

## 2019-02-20 NOTE — L&D Delivery Note (Signed)
Delivery Note   Patient Name: Brooke Dickerson DOB: 1980/07/28 MRN: [id number]  Date of admission: 12/05/2019 Delivering MD: Noralyn Pick  Date of delivery: 12/05/19 Type of delivery: SVD  Newborn Data: Live born female  Birth Weight:   APGAR: 37, 30  Newborn Delivery   Birth date/time: 12/05/2019 16:57:00 Delivery type: Vaginal, Spontaneous     [name], 39 y.o., @ [redacted]w[redacted]d,  G3P1011, who was admitted for IOL for AMA, GDMA2 on glyburide, CBG WNL during labor, no meds now, HSV+ no lesions, progressed with AROM and pitocin. I was called to the room when she progressed 2+ station in the second stage of labor.  She pushed for 15/min.  She delivered a viable infant, cephalic and restituted to the OP position over an intact perineum.  A nuchal cord   was not identified. The baby was placed on maternal abdomen while initial step of NRP were perfmored (Dry, Stimulated, and warmed). Hat placed on baby for thermoregulation. Delayed cord clamping was performed for 2 minutes.  Cord double clamped and cut.  Cord cut by father. Apgar scores were 9 and 9. Prophylactic Pitocin was started in the third stage of labor for active management. The placenta delivered spontaneously, shultz, with a 3 vessel cord and was sent to LD.  Inspection revealed none. An examination of the vaginal vault and cervix was free from lacerations. The uterus was firm, bleeding stable. Swollen left bartholin's cyst noted, no drainage.   Placenta and umbilical artery blood gas were not sent.  There were no complications during the procedure.  Mom and baby skin to skin following delivery. Left in stable condition.  Maternal Info: Anesthesia: Epidural Episiotomy: no Lacerations:  no Suture Repair: no Est. Blood Loss (mL):  157  Newborn Info:  Baby Sex: female Circumcision: undecided Babies Name: Jeneen Rinks APGAR (1 MIN): 9   APGAR (5 MINS): 9   APGAR (10 MINS):     Mom to postpartum.  Baby to Couplet care / Skin to  Skin.  Dr Mancel Bale updated.   [place], [place], NP-C 12/05/19 5:19 PM

## 2019-05-14 ENCOUNTER — Encounter: Payer: Self-pay | Admitting: Obstetrics and Gynecology

## 2019-05-14 LAB — OB RESULTS CONSOLE HEPATITIS B SURFACE ANTIGEN: Hepatitis B Surface Ag: NEGATIVE

## 2019-05-14 LAB — OB RESULTS CONSOLE RPR: RPR: NONREACTIVE

## 2019-05-14 LAB — OB RESULTS CONSOLE GC/CHLAMYDIA
Chlamydia: NEGATIVE
Gonorrhea: NEGATIVE

## 2019-05-14 LAB — OB RESULTS CONSOLE HIV ANTIBODY (ROUTINE TESTING): HIV: NONREACTIVE

## 2019-05-14 LAB — OB RESULTS CONSOLE RUBELLA ANTIBODY, IGM: Rubella: IMMUNE

## 2019-06-12 ENCOUNTER — Other Ambulatory Visit (HOSPITAL_COMMUNITY): Payer: Self-pay | Admitting: Obstetrics and Gynecology

## 2019-06-12 DIAGNOSIS — Z3A19 19 weeks gestation of pregnancy: Secondary | ICD-10-CM

## 2019-06-12 DIAGNOSIS — O09522 Supervision of elderly multigravida, second trimester: Secondary | ICD-10-CM

## 2019-06-12 DIAGNOSIS — Z363 Encounter for antenatal screening for malformations: Secondary | ICD-10-CM

## 2019-07-13 ENCOUNTER — Encounter: Payer: Self-pay | Admitting: *Deleted

## 2019-07-15 ENCOUNTER — Other Ambulatory Visit: Payer: Self-pay | Admitting: *Deleted

## 2019-07-15 ENCOUNTER — Ambulatory Visit (HOSPITAL_COMMUNITY): Payer: PRIVATE HEALTH INSURANCE | Attending: Obstetrics and Gynecology

## 2019-07-15 ENCOUNTER — Other Ambulatory Visit: Payer: Self-pay

## 2019-07-15 ENCOUNTER — Ambulatory Visit: Payer: PRIVATE HEALTH INSURANCE | Admitting: *Deleted

## 2019-07-15 VITALS — BP 135/85 | HR 105

## 2019-07-15 DIAGNOSIS — Z363 Encounter for antenatal screening for malformations: Secondary | ICD-10-CM | POA: Diagnosis present

## 2019-07-15 DIAGNOSIS — O09522 Supervision of elderly multigravida, second trimester: Secondary | ICD-10-CM

## 2019-07-15 DIAGNOSIS — D259 Leiomyoma of uterus, unspecified: Secondary | ICD-10-CM

## 2019-07-15 DIAGNOSIS — O09292 Supervision of pregnancy with other poor reproductive or obstetric history, second trimester: Secondary | ICD-10-CM | POA: Diagnosis not present

## 2019-07-15 DIAGNOSIS — O0990 Supervision of high risk pregnancy, unspecified, unspecified trimester: Secondary | ICD-10-CM

## 2019-07-15 DIAGNOSIS — O99212 Obesity complicating pregnancy, second trimester: Secondary | ICD-10-CM

## 2019-07-15 DIAGNOSIS — Z3A19 19 weeks gestation of pregnancy: Secondary | ICD-10-CM

## 2019-07-15 DIAGNOSIS — O3412 Maternal care for benign tumor of corpus uteri, second trimester: Secondary | ICD-10-CM

## 2019-07-15 DIAGNOSIS — E669 Obesity, unspecified: Secondary | ICD-10-CM

## 2019-08-13 ENCOUNTER — Ambulatory Visit: Payer: PRIVATE HEALTH INSURANCE | Admitting: *Deleted

## 2019-08-13 ENCOUNTER — Other Ambulatory Visit: Payer: Self-pay

## 2019-08-13 ENCOUNTER — Ambulatory Visit: Payer: PRIVATE HEALTH INSURANCE | Attending: Obstetrics and Gynecology

## 2019-08-13 ENCOUNTER — Other Ambulatory Visit: Payer: Self-pay | Admitting: *Deleted

## 2019-08-13 VITALS — BP 122/81 | HR 113

## 2019-08-13 DIAGNOSIS — D259 Leiomyoma of uterus, unspecified: Secondary | ICD-10-CM

## 2019-08-13 DIAGNOSIS — Z8759 Personal history of other complications of pregnancy, childbirth and the puerperium: Secondary | ICD-10-CM

## 2019-08-13 DIAGNOSIS — O09292 Supervision of pregnancy with other poor reproductive or obstetric history, second trimester: Secondary | ICD-10-CM

## 2019-08-13 DIAGNOSIS — Z363 Encounter for antenatal screening for malformations: Secondary | ICD-10-CM

## 2019-08-13 DIAGNOSIS — O321XX0 Maternal care for breech presentation, not applicable or unspecified: Secondary | ICD-10-CM

## 2019-08-13 DIAGNOSIS — O99212 Obesity complicating pregnancy, second trimester: Secondary | ICD-10-CM | POA: Diagnosis not present

## 2019-08-13 DIAGNOSIS — O09529 Supervision of elderly multigravida, unspecified trimester: Secondary | ICD-10-CM

## 2019-08-13 DIAGNOSIS — Z3A23 23 weeks gestation of pregnancy: Secondary | ICD-10-CM

## 2019-08-13 DIAGNOSIS — O09522 Supervision of elderly multigravida, second trimester: Secondary | ICD-10-CM | POA: Diagnosis not present

## 2019-08-13 DIAGNOSIS — O3412 Maternal care for benign tumor of corpus uteri, second trimester: Secondary | ICD-10-CM | POA: Insufficient documentation

## 2019-09-17 ENCOUNTER — Ambulatory Visit: Payer: PRIVATE HEALTH INSURANCE | Admitting: *Deleted

## 2019-09-17 ENCOUNTER — Other Ambulatory Visit: Payer: Self-pay

## 2019-09-17 ENCOUNTER — Ambulatory Visit: Payer: PRIVATE HEALTH INSURANCE | Attending: Obstetrics and Gynecology

## 2019-09-17 ENCOUNTER — Other Ambulatory Visit: Payer: Self-pay | Admitting: *Deleted

## 2019-09-17 VITALS — BP 122/73 | HR 110

## 2019-09-17 DIAGNOSIS — O09293 Supervision of pregnancy with other poor reproductive or obstetric history, third trimester: Secondary | ICD-10-CM | POA: Diagnosis not present

## 2019-09-17 DIAGNOSIS — O09529 Supervision of elderly multigravida, unspecified trimester: Secondary | ICD-10-CM | POA: Insufficient documentation

## 2019-09-17 DIAGNOSIS — O09523 Supervision of elderly multigravida, third trimester: Secondary | ICD-10-CM

## 2019-09-17 DIAGNOSIS — O0990 Supervision of high risk pregnancy, unspecified, unspecified trimester: Secondary | ICD-10-CM | POA: Diagnosis present

## 2019-09-17 DIAGNOSIS — O3413 Maternal care for benign tumor of corpus uteri, third trimester: Secondary | ICD-10-CM

## 2019-09-17 DIAGNOSIS — O99213 Obesity complicating pregnancy, third trimester: Secondary | ICD-10-CM

## 2019-09-17 DIAGNOSIS — D259 Leiomyoma of uterus, unspecified: Secondary | ICD-10-CM

## 2019-09-17 DIAGNOSIS — E669 Obesity, unspecified: Secondary | ICD-10-CM

## 2019-09-17 DIAGNOSIS — Z364 Encounter for antenatal screening for fetal growth retardation: Secondary | ICD-10-CM | POA: Diagnosis not present

## 2019-09-17 DIAGNOSIS — Z3A28 28 weeks gestation of pregnancy: Secondary | ICD-10-CM

## 2019-10-07 ENCOUNTER — Other Ambulatory Visit: Payer: Self-pay

## 2019-10-07 ENCOUNTER — Encounter: Payer: PRIVATE HEALTH INSURANCE | Attending: Obstetrics and Gynecology | Admitting: Registered"

## 2019-10-07 ENCOUNTER — Encounter: Payer: Self-pay | Admitting: Registered"

## 2019-10-07 DIAGNOSIS — O24419 Gestational diabetes mellitus in pregnancy, unspecified control: Secondary | ICD-10-CM

## 2019-10-07 NOTE — Progress Notes (Signed)
Patient was seen on 10/07/19 for Gestational Diabetes self-management class at [facility]. The following learning objectives were met by the patient during this course:   States the definition of Gestational Diabetes  States why dietary management is important in controlling blood glucose  Describes the effects each nutrient has on blood glucose levels  Demonstrates ability to create a balanced meal plan  Demonstrates carbohydrate counting   States when to check blood glucose levels  Demonstrates proper blood glucose monitoring techniques  States the effect of stress and exercise on blood glucose levels  States the importance of limiting caffeine and abstaining from alcohol and smoking  Blood glucose monitor given: [employer] Lot # [id number] Exp: 10/20/19 Blood glucose reading: 85 mg/dL  Patient instructed to monitor glucose levels: FBS: 60 - <95; 1 hour: <140; 2 hour: <120  Patient received handouts:  Nutrition Diabetes and Pregnancy, including carb counting list  Patient will be seen for follow-up as needed.

## 2019-10-15 ENCOUNTER — Other Ambulatory Visit: Payer: Self-pay | Admitting: *Deleted

## 2019-10-15 ENCOUNTER — Ambulatory Visit: Payer: PRIVATE HEALTH INSURANCE | Attending: Obstetrics and Gynecology

## 2019-10-15 ENCOUNTER — Other Ambulatory Visit: Payer: Self-pay

## 2019-10-15 ENCOUNTER — Other Ambulatory Visit: Payer: Self-pay | Admitting: Maternal & Fetal Medicine

## 2019-10-15 ENCOUNTER — Ambulatory Visit: Payer: PRIVATE HEALTH INSURANCE | Admitting: *Deleted

## 2019-10-15 VITALS — BP 117/72 | HR 94

## 2019-10-15 DIAGNOSIS — O09523 Supervision of elderly multigravida, third trimester: Secondary | ICD-10-CM

## 2019-10-15 DIAGNOSIS — O99213 Obesity complicating pregnancy, third trimester: Secondary | ICD-10-CM | POA: Diagnosis not present

## 2019-10-15 DIAGNOSIS — O09529 Supervision of elderly multigravida, unspecified trimester: Secondary | ICD-10-CM | POA: Insufficient documentation

## 2019-10-15 DIAGNOSIS — Z362 Encounter for other antenatal screening follow-up: Secondary | ICD-10-CM | POA: Diagnosis not present

## 2019-10-15 DIAGNOSIS — O24410 Gestational diabetes mellitus in pregnancy, diet controlled: Secondary | ICD-10-CM | POA: Insufficient documentation

## 2019-10-15 DIAGNOSIS — O3413 Maternal care for benign tumor of corpus uteri, third trimester: Secondary | ICD-10-CM | POA: Diagnosis not present

## 2019-10-15 DIAGNOSIS — D259 Leiomyoma of uterus, unspecified: Secondary | ICD-10-CM

## 2019-10-15 DIAGNOSIS — O09293 Supervision of pregnancy with other poor reproductive or obstetric history, third trimester: Secondary | ICD-10-CM | POA: Diagnosis not present

## 2019-10-15 DIAGNOSIS — E669 Obesity, unspecified: Secondary | ICD-10-CM

## 2019-10-15 DIAGNOSIS — Z3A32 32 weeks gestation of pregnancy: Secondary | ICD-10-CM

## 2019-10-22 ENCOUNTER — Ambulatory Visit: Payer: PRIVATE HEALTH INSURANCE | Attending: Obstetrics and Gynecology

## 2019-10-22 ENCOUNTER — Ambulatory Visit: Payer: PRIVATE HEALTH INSURANCE | Admitting: *Deleted

## 2019-10-22 ENCOUNTER — Other Ambulatory Visit: Payer: Self-pay

## 2019-10-22 VITALS — BP 117/71 | HR 107

## 2019-10-22 DIAGNOSIS — O3413 Maternal care for benign tumor of corpus uteri, third trimester: Secondary | ICD-10-CM

## 2019-10-22 DIAGNOSIS — O24410 Gestational diabetes mellitus in pregnancy, diet controlled: Secondary | ICD-10-CM | POA: Diagnosis not present

## 2019-10-22 DIAGNOSIS — O09293 Supervision of pregnancy with other poor reproductive or obstetric history, third trimester: Secondary | ICD-10-CM

## 2019-10-22 DIAGNOSIS — O359XX0 Maternal care for (suspected) fetal abnormality and damage, unspecified, not applicable or unspecified: Secondary | ICD-10-CM

## 2019-10-22 DIAGNOSIS — O09523 Supervision of elderly multigravida, third trimester: Secondary | ICD-10-CM | POA: Diagnosis not present

## 2019-10-22 DIAGNOSIS — D259 Leiomyoma of uterus, unspecified: Secondary | ICD-10-CM

## 2019-10-22 DIAGNOSIS — Z3A33 33 weeks gestation of pregnancy: Secondary | ICD-10-CM

## 2019-10-22 DIAGNOSIS — Z362 Encounter for other antenatal screening follow-up: Secondary | ICD-10-CM

## 2019-10-28 ENCOUNTER — Ambulatory Visit: Payer: No Typology Code available for payment source

## 2019-11-04 ENCOUNTER — Ambulatory Visit: Payer: PRIVATE HEALTH INSURANCE | Attending: Obstetrics and Gynecology

## 2019-11-04 ENCOUNTER — Ambulatory Visit: Payer: PRIVATE HEALTH INSURANCE | Admitting: *Deleted

## 2019-11-04 ENCOUNTER — Other Ambulatory Visit: Payer: Self-pay

## 2019-11-04 VITALS — BP 107/65 | HR 98

## 2019-11-04 DIAGNOSIS — O09523 Supervision of elderly multigravida, third trimester: Secondary | ICD-10-CM | POA: Diagnosis not present

## 2019-11-04 DIAGNOSIS — O3413 Maternal care for benign tumor of corpus uteri, third trimester: Secondary | ICD-10-CM

## 2019-11-04 DIAGNOSIS — D259 Leiomyoma of uterus, unspecified: Secondary | ICD-10-CM

## 2019-11-04 DIAGNOSIS — O24415 Gestational diabetes mellitus in pregnancy, controlled by oral hypoglycemic drugs: Secondary | ICD-10-CM | POA: Diagnosis not present

## 2019-11-04 DIAGNOSIS — O359XX0 Maternal care for (suspected) fetal abnormality and damage, unspecified, not applicable or unspecified: Secondary | ICD-10-CM | POA: Diagnosis not present

## 2019-11-04 DIAGNOSIS — O09293 Supervision of pregnancy with other poor reproductive or obstetric history, third trimester: Secondary | ICD-10-CM

## 2019-11-04 DIAGNOSIS — O09529 Supervision of elderly multigravida, unspecified trimester: Secondary | ICD-10-CM

## 2019-11-04 DIAGNOSIS — Z7984 Long term (current) use of oral hypoglycemic drugs: Secondary | ICD-10-CM

## 2019-11-04 DIAGNOSIS — O24410 Gestational diabetes mellitus in pregnancy, diet controlled: Secondary | ICD-10-CM | POA: Diagnosis not present

## 2019-11-04 DIAGNOSIS — Z3A35 35 weeks gestation of pregnancy: Secondary | ICD-10-CM

## 2019-11-09 LAB — OB RESULTS CONSOLE GBS: GBS: NEGATIVE

## 2019-11-11 ENCOUNTER — Other Ambulatory Visit: Payer: Self-pay

## 2019-11-11 ENCOUNTER — Ambulatory Visit: Payer: PRIVATE HEALTH INSURANCE | Attending: Obstetrics and Gynecology

## 2019-11-11 ENCOUNTER — Encounter: Payer: Self-pay | Admitting: *Deleted

## 2019-11-11 ENCOUNTER — Ambulatory Visit: Payer: PRIVATE HEALTH INSURANCE | Admitting: *Deleted

## 2019-11-11 VITALS — BP 122/74 | HR 94

## 2019-11-11 DIAGNOSIS — O09529 Supervision of elderly multigravida, unspecified trimester: Secondary | ICD-10-CM

## 2019-11-11 DIAGNOSIS — O24415 Gestational diabetes mellitus in pregnancy, controlled by oral hypoglycemic drugs: Secondary | ICD-10-CM | POA: Diagnosis not present

## 2019-11-11 DIAGNOSIS — O24410 Gestational diabetes mellitus in pregnancy, diet controlled: Secondary | ICD-10-CM

## 2019-11-11 DIAGNOSIS — O09523 Supervision of elderly multigravida, third trimester: Secondary | ICD-10-CM | POA: Diagnosis not present

## 2019-11-11 DIAGNOSIS — O3413 Maternal care for benign tumor of corpus uteri, third trimester: Secondary | ICD-10-CM | POA: Diagnosis not present

## 2019-11-11 DIAGNOSIS — Z3A36 36 weeks gestation of pregnancy: Secondary | ICD-10-CM

## 2019-11-11 DIAGNOSIS — O359XX0 Maternal care for (suspected) fetal abnormality and damage, unspecified, not applicable or unspecified: Secondary | ICD-10-CM | POA: Diagnosis not present

## 2019-11-11 DIAGNOSIS — D259 Leiomyoma of uterus, unspecified: Secondary | ICD-10-CM

## 2019-11-12 ENCOUNTER — Other Ambulatory Visit: Payer: Self-pay | Admitting: *Deleted

## 2019-11-12 DIAGNOSIS — O09529 Supervision of elderly multigravida, unspecified trimester: Secondary | ICD-10-CM

## 2019-11-19 ENCOUNTER — Ambulatory Visit: Payer: PRIVATE HEALTH INSURANCE | Admitting: *Deleted

## 2019-11-19 ENCOUNTER — Ambulatory Visit: Payer: PRIVATE HEALTH INSURANCE | Attending: Obstetrics and Gynecology

## 2019-11-19 ENCOUNTER — Other Ambulatory Visit: Payer: Self-pay

## 2019-11-19 VITALS — BP 120/73 | HR 95

## 2019-11-19 DIAGNOSIS — O09523 Supervision of elderly multigravida, third trimester: Secondary | ICD-10-CM

## 2019-11-19 DIAGNOSIS — O24415 Gestational diabetes mellitus in pregnancy, controlled by oral hypoglycemic drugs: Secondary | ICD-10-CM

## 2019-11-19 DIAGNOSIS — O3413 Maternal care for benign tumor of corpus uteri, third trimester: Secondary | ICD-10-CM

## 2019-11-19 DIAGNOSIS — O09529 Supervision of elderly multigravida, unspecified trimester: Secondary | ICD-10-CM | POA: Diagnosis present

## 2019-11-19 DIAGNOSIS — O359XX0 Maternal care for (suspected) fetal abnormality and damage, unspecified, not applicable or unspecified: Secondary | ICD-10-CM | POA: Diagnosis not present

## 2019-11-19 DIAGNOSIS — Z362 Encounter for other antenatal screening follow-up: Secondary | ICD-10-CM

## 2019-11-19 DIAGNOSIS — Z3A37 37 weeks gestation of pregnancy: Secondary | ICD-10-CM

## 2019-11-19 DIAGNOSIS — D259 Leiomyoma of uterus, unspecified: Secondary | ICD-10-CM

## 2019-11-25 ENCOUNTER — Encounter (HOSPITAL_COMMUNITY): Payer: Self-pay | Admitting: *Deleted

## 2019-11-25 ENCOUNTER — Telehealth (HOSPITAL_COMMUNITY): Payer: Self-pay | Admitting: *Deleted

## 2019-11-25 ENCOUNTER — Other Ambulatory Visit: Payer: Self-pay | Admitting: Obstetrics and Gynecology

## 2019-11-25 NOTE — Telephone Encounter (Signed)
Preadmission screen  

## 2019-11-26 ENCOUNTER — Other Ambulatory Visit: Payer: Self-pay

## 2019-11-26 ENCOUNTER — Ambulatory Visit: Payer: PRIVATE HEALTH INSURANCE | Admitting: *Deleted

## 2019-11-26 ENCOUNTER — Other Ambulatory Visit: Payer: Self-pay | Admitting: *Deleted

## 2019-11-26 ENCOUNTER — Ambulatory Visit: Payer: PRIVATE HEALTH INSURANCE | Attending: Obstetrics and Gynecology

## 2019-11-26 VITALS — BP 128/81 | HR 97

## 2019-11-26 DIAGNOSIS — O24415 Gestational diabetes mellitus in pregnancy, controlled by oral hypoglycemic drugs: Secondary | ICD-10-CM

## 2019-11-26 DIAGNOSIS — O359XX0 Maternal care for (suspected) fetal abnormality and damage, unspecified, not applicable or unspecified: Secondary | ICD-10-CM

## 2019-11-26 DIAGNOSIS — O3413 Maternal care for benign tumor of corpus uteri, third trimester: Secondary | ICD-10-CM

## 2019-11-26 DIAGNOSIS — D259 Leiomyoma of uterus, unspecified: Secondary | ICD-10-CM

## 2019-11-26 DIAGNOSIS — O24410 Gestational diabetes mellitus in pregnancy, diet controlled: Secondary | ICD-10-CM

## 2019-11-26 DIAGNOSIS — O09523 Supervision of elderly multigravida, third trimester: Secondary | ICD-10-CM

## 2019-11-26 DIAGNOSIS — O09529 Supervision of elderly multigravida, unspecified trimester: Secondary | ICD-10-CM | POA: Diagnosis not present

## 2019-11-26 DIAGNOSIS — Z3A38 38 weeks gestation of pregnancy: Secondary | ICD-10-CM

## 2019-11-26 DIAGNOSIS — Z7984 Long term (current) use of oral hypoglycemic drugs: Secondary | ICD-10-CM

## 2019-12-02 ENCOUNTER — Other Ambulatory Visit (HOSPITAL_COMMUNITY)
Admission: RE | Admit: 2019-12-02 | Discharge: 2019-12-02 | Disposition: A | Discharge status: Home / Self Care | Payer: PRIVATE HEALTH INSURANCE | Source: Ambulatory Visit | Attending: Obstetrics and Gynecology | Admitting: Obstetrics and Gynecology

## 2019-12-02 DIAGNOSIS — Z20822 Contact with and (suspected) exposure to covid-19: Secondary | ICD-10-CM | POA: Diagnosis not present

## 2019-12-02 DIAGNOSIS — Z01812 Encounter for preprocedural laboratory examination: Secondary | ICD-10-CM | POA: Insufficient documentation

## 2019-12-02 LAB — SARS CORONAVIRUS 2 (TAT 6-24 HRS): SARS Coronavirus 2: NEGATIVE

## 2019-12-03 ENCOUNTER — Ambulatory Visit (HOSPITAL_BASED_OUTPATIENT_CLINIC_OR_DEPARTMENT_OTHER): Payer: PRIVATE HEALTH INSURANCE

## 2019-12-03 ENCOUNTER — Ambulatory Visit: Payer: PRIVATE HEALTH INSURANCE | Admitting: *Deleted

## 2019-12-03 ENCOUNTER — Other Ambulatory Visit: Payer: Self-pay

## 2019-12-03 VITALS — BP 126/77 | HR 99

## 2019-12-03 DIAGNOSIS — O24415 Gestational diabetes mellitus in pregnancy, controlled by oral hypoglycemic drugs: Secondary | ICD-10-CM

## 2019-12-03 DIAGNOSIS — O359XX0 Maternal care for (suspected) fetal abnormality and damage, unspecified, not applicable or unspecified: Secondary | ICD-10-CM | POA: Diagnosis not present

## 2019-12-03 DIAGNOSIS — O09523 Supervision of elderly multigravida, third trimester: Secondary | ICD-10-CM

## 2019-12-03 DIAGNOSIS — O3413 Maternal care for benign tumor of corpus uteri, third trimester: Secondary | ICD-10-CM | POA: Diagnosis not present

## 2019-12-03 DIAGNOSIS — D259 Leiomyoma of uterus, unspecified: Secondary | ICD-10-CM

## 2019-12-03 DIAGNOSIS — Z3A39 39 weeks gestation of pregnancy: Secondary | ICD-10-CM

## 2019-12-04 ENCOUNTER — Inpatient Hospital Stay (HOSPITAL_COMMUNITY): Payer: PRIVATE HEALTH INSURANCE

## 2019-12-05 ENCOUNTER — Other Ambulatory Visit: Payer: Self-pay

## 2019-12-05 ENCOUNTER — Inpatient Hospital Stay (HOSPITAL_COMMUNITY)
Admission: AD | Admit: 2019-12-05 | Discharge: 2019-12-07 | DRG: 807 | Disposition: A | Discharge status: Home / Self Care | Payer: PRIVATE HEALTH INSURANCE | Attending: Obstetrics and Gynecology | Admitting: Obstetrics and Gynecology

## 2019-12-05 ENCOUNTER — Inpatient Hospital Stay (HOSPITAL_COMMUNITY): Payer: PRIVATE HEALTH INSURANCE | Admitting: Anesthesiology

## 2019-12-05 ENCOUNTER — Encounter (HOSPITAL_COMMUNITY): Payer: Self-pay | Admitting: Obstetrics and Gynecology

## 2019-12-05 DIAGNOSIS — O99892 Other specified diseases and conditions complicating childbirth: Secondary | ICD-10-CM | POA: Diagnosis present

## 2019-12-05 DIAGNOSIS — Z3A39 39 weeks gestation of pregnancy: Secondary | ICD-10-CM | POA: Diagnosis not present

## 2019-12-05 DIAGNOSIS — O26893 Other specified pregnancy related conditions, third trimester: Secondary | ICD-10-CM | POA: Diagnosis present

## 2019-12-05 DIAGNOSIS — Z23 Encounter for immunization: Secondary | ICD-10-CM | POA: Diagnosis not present

## 2019-12-05 DIAGNOSIS — N750 Cyst of Bartholin's gland: Secondary | ICD-10-CM | POA: Diagnosis present

## 2019-12-05 DIAGNOSIS — O24425 Gestational diabetes mellitus in childbirth, controlled by oral hypoglycemic drugs: Secondary | ICD-10-CM | POA: Diagnosis present

## 2019-12-05 DIAGNOSIS — Z6791 Unspecified blood type, Rh negative: Secondary | ICD-10-CM

## 2019-12-05 DIAGNOSIS — O24419 Gestational diabetes mellitus in pregnancy, unspecified control: Secondary | ICD-10-CM | POA: Diagnosis present

## 2019-12-05 LAB — CBC
HCT: 36.1 % (ref 36.0–46.0)
Hemoglobin: 11.4 g/dL — ABNORMAL LOW (ref 12.0–15.0)
MCH: 29.5 pg (ref 26.0–34.0)
MCHC: 31.6 g/dL (ref 30.0–36.0)
MCV: 93.3 fL (ref 80.0–100.0)
Platelets: 331 10*3/uL (ref 150–400)
RBC: 3.87 MIL/uL (ref 3.87–5.11)
RDW: 15.3 % (ref 11.5–15.5)
WBC: 8 10*3/uL (ref 4.0–10.5)
nRBC: 0 % (ref 0.0–0.2)

## 2019-12-05 LAB — GLUCOSE, CAPILLARY
Glucose-Capillary: 77 mg/dL (ref 70–99)
Glucose-Capillary: 62 mg/dL — ABNORMAL LOW (ref 70–99)
Glucose-Capillary: 71 mg/dL (ref 70–99)

## 2019-12-05 LAB — TYPE AND SCREEN
ABO/RH(D): AB NEG
Antibody Screen: POSITIVE

## 2019-12-05 LAB — RPR: RPR Ser Ql: NONREACTIVE

## 2019-12-05 MED ORDER — TETANUS-DIPHTH-ACELL PERTUSSIS 5-2.5-18.5 LF-MCG/0.5 IM SUSP: 0.5000 mL | Freq: Once | INTRAMUSCULAR | Status: DC

## 2019-12-05 MED ORDER — LIDOCAINE HCL (PF) 1 % IJ SOLN: 30.0000 mL | INTRAMUSCULAR | Status: DC | PRN

## 2019-12-05 MED ORDER — EPHEDRINE 5 MG/ML INJ: 10.0000 mg | INTRAVENOUS | Status: DC | PRN

## 2019-12-05 MED ORDER — LACTATED RINGERS IV SOLN: INTRAVENOUS | Status: DC

## 2019-12-05 MED ORDER — BUTORPHANOL TARTRATE 1 MG/ML IJ SOLN: 1.0000 mg | INTRAMUSCULAR | Status: DC | PRN

## 2019-12-05 MED ORDER — ONDANSETRON HCL 4 MG/2ML IJ SOLN: 4.0000 mg | Freq: Four times a day (QID) | INTRAMUSCULAR | Status: DC | PRN

## 2019-12-05 MED ORDER — INFLUENZA VAC SPLIT QUAD 0.5 ML IM SUSY
0.5000 mL | PREFILLED_SYRINGE | INTRAMUSCULAR | Status: AC
  Administered 2019-12-06: 0.5 mL via INTRAMUSCULAR
  Filled 2019-12-05: qty 0.5

## 2019-12-05 MED ORDER — DIBUCAINE (PERIANAL) 1 % EX OINT: 1.0000 "application " | TOPICAL_OINTMENT | CUTANEOUS | Status: DC | PRN

## 2019-12-05 MED ORDER — ONDANSETRON HCL 4 MG PO TABS: 4.0000 mg | ORAL_TABLET | ORAL | Status: DC | PRN

## 2019-12-05 MED ORDER — ACETAMINOPHEN 325 MG PO TABS: 650.0000 mg | ORAL_TABLET | ORAL | Status: DC | PRN

## 2019-12-05 MED ORDER — FENTANYL-BUPIVACAINE-NACL 0.5-0.125-0.9 MG/250ML-% EP SOLN
12.0000 mL/h | EPIDURAL | Status: DC | PRN
  Filled 2019-12-05: qty 250

## 2019-12-05 MED ORDER — LACTATED RINGERS IV SOLN
500.0000 mL | Freq: Once | INTRAVENOUS | Status: AC
  Administered 2019-12-05: 500 mL via INTRAVENOUS

## 2019-12-05 MED ORDER — ACETAMINOPHEN 325 MG PO TABS
650.0000 mg | ORAL_TABLET | ORAL | Status: DC | PRN
  Administered 2019-12-05: 650 mg via ORAL
  Filled 2019-12-05: qty 2

## 2019-12-05 MED ORDER — OXYTOCIN-SODIUM CHLORIDE 30-0.9 UT/500ML-% IV SOLN: 2.5000 [IU]/h | INTRAVENOUS | Status: DC

## 2019-12-05 MED ORDER — LACTATED RINGERS IV SOLN: 500.0000 mL | INTRAVENOUS | Status: DC | PRN

## 2019-12-05 MED ORDER — LIDOCAINE HCL (PF) 1 % IJ SOLN
INTRAMUSCULAR | Status: DC | PRN
  Administered 2019-12-05: 8 mL via EPIDURAL

## 2019-12-05 MED ORDER — OXYTOCIN BOLUS FROM INFUSION
333.0000 mL | Freq: Once | INTRAVENOUS | Status: AC
  Administered 2019-12-05: 333 mL via INTRAVENOUS

## 2019-12-05 MED ORDER — DIPHENHYDRAMINE HCL 25 MG PO CAPS: 25.0000 mg | ORAL_CAPSULE | Freq: Four times a day (QID) | ORAL | Status: DC | PRN

## 2019-12-05 MED ORDER — TERBUTALINE SULFATE 1 MG/ML IJ SOLN: 0.2500 mg | Freq: Once | INTRAMUSCULAR | Status: DC | PRN

## 2019-12-05 MED ORDER — ZOLPIDEM TARTRATE 5 MG PO TABS: 5.0000 mg | ORAL_TABLET | Freq: Every evening | ORAL | Status: DC | PRN

## 2019-12-05 MED ORDER — SOD CITRATE-CITRIC ACID 500-334 MG/5ML PO SOLN: 30.0000 mL | ORAL | Status: DC | PRN

## 2019-12-05 MED ORDER — IBUPROFEN 600 MG PO TABS
600.0000 mg | ORAL_TABLET | Freq: Four times a day (QID) | ORAL | Status: DC
  Administered 2019-12-05 – 2019-12-07 (×4): 600 mg via ORAL
  Filled 2019-12-05 (×6): qty 1

## 2019-12-05 MED ORDER — SIMETHICONE 80 MG PO CHEW: 80.0000 mg | CHEWABLE_TABLET | ORAL | Status: DC | PRN

## 2019-12-05 MED ORDER — COCONUT OIL OIL: 1.0000 "application " | TOPICAL_OIL | Status: DC | PRN

## 2019-12-05 MED ORDER — WITCH HAZEL-GLYCERIN EX PADS: 1.0000 "application " | MEDICATED_PAD | CUTANEOUS | Status: DC | PRN

## 2019-12-05 MED ORDER — DIPHENHYDRAMINE HCL 50 MG/ML IJ SOLN: 12.5000 mg | INTRAMUSCULAR | Status: DC | PRN

## 2019-12-05 MED ORDER — MISOPROSTOL 25 MCG QUARTER TABLET: 25.0000 ug | ORAL_TABLET | ORAL | Status: DC | PRN

## 2019-12-05 MED ORDER — SENNOSIDES-DOCUSATE SODIUM 8.6-50 MG PO TABS
2.0000 | ORAL_TABLET | ORAL | Status: DC
  Administered 2019-12-05 – 2019-12-06 (×2): 2 via ORAL
  Filled 2019-12-05 (×2): qty 2

## 2019-12-05 MED ORDER — HYDROXYZINE HCL 50 MG PO TABS: 50.0000 mg | ORAL_TABLET | Freq: Four times a day (QID) | ORAL | Status: DC | PRN

## 2019-12-05 MED ORDER — PHENYLEPHRINE 40 MCG/ML (10ML) SYRINGE FOR IV PUSH (FOR BLOOD PRESSURE SUPPORT): 80.0000 ug | PREFILLED_SYRINGE | INTRAVENOUS | Status: DC | PRN

## 2019-12-05 MED ORDER — ONDANSETRON HCL 4 MG/2ML IJ SOLN: 4.0000 mg | INTRAMUSCULAR | Status: DC | PRN

## 2019-12-05 MED ORDER — OXYTOCIN-SODIUM CHLORIDE 30-0.9 UT/500ML-% IV SOLN
1.0000 m[IU]/min | INTRAVENOUS | Status: DC
  Administered 2019-12-05: 1 m[IU]/min via INTRAVENOUS
  Filled 2019-12-05: qty 500

## 2019-12-05 MED ORDER — OXYTOCIN-SODIUM CHLORIDE 30-0.9 UT/500ML-% IV SOLN: 1.0000 m[IU]/min | INTRAVENOUS | Status: DC

## 2019-12-05 MED ORDER — PRENATAL MULTIVITAMIN CH
1.0000 | ORAL_TABLET | Freq: Every day | ORAL | Status: DC
  Administered 2019-12-06: 1 via ORAL
  Filled 2019-12-05: qty 1

## 2019-12-05 MED ORDER — SODIUM CHLORIDE (PF) 0.9 % IJ SOLN
INTRAMUSCULAR | Status: DC | PRN
  Administered 2019-12-05: 12 mL/h via EPIDURAL

## 2019-12-05 MED ORDER — BENZOCAINE-MENTHOL 20-0.5 % EX AERO
1.0000 "application " | INHALATION_SPRAY | CUTANEOUS | Status: DC | PRN
  Filled 2019-12-05: qty 56

## 2019-12-05 NOTE — Progress Notes (Signed)
Wrong chart

## 2019-12-05 NOTE — Progress Notes (Signed)
Pt unable to void 

## 2019-12-05 NOTE — Progress Notes (Signed)
tct Crumpler, CNM and made aware of need for orders. Crumpler states that she has tried to put in orders and some are already in. Sates that she will be around in about an hour and would put orders in then. Asked if she wanted, I could call Dr. Charlesetta Garibaldi for orders and she denied this request. She states we should just room the patient and wait for rounds and she would put orders in then.

## 2019-12-05 NOTE — Anesthesia Procedure Notes (Signed)
Epidural Patient location during procedure: OB Start time: 12/05/2019 12:55 PM End time: 12/05/2019 1:02 PM  Staffing Anesthesiologist: Merlinda Frederick, MD Performed: anesthesiologist   Preanesthetic Checklist Completed: patient identified, IV checked, site marked, risks and benefits discussed, monitors and equipment checked, pre-op evaluation and timeout performed  Epidural Patient position: sitting Prep: DuraPrep Patient monitoring: heart rate, cardiac monitor, continuous pulse ox and blood pressure Approach: midline Location: L2-L3 Injection technique: LOR saline  Needle:  Needle type: Tuohy  Needle gauge: 17 G Needle length: 9 cm Needle insertion depth: 4 cm Catheter type: closed end flexible Catheter size: 20 Guage Catheter at skin depth: 9 cm Test dose: negative and Other  Assessment Events: blood not aspirated, injection not painful, no injection resistance and negative IV test  Additional Notes Informed consent obtained prior to proceeding including risk of failure, 1% risk of PDPH, risk of minor discomfort and bruising.  Discussed rare but serious complications including epidural abscess, permanent nerve injury, epidural hematoma.  Discussed alternatives to epidural analgesia and patient desires to proceed.  Timeout performed pre-procedure verifying patient name, procedure, and platelet count.  Patient tolerated procedure well.

## 2019-12-05 NOTE — Progress Notes (Signed)
Juice given

## 2019-12-05 NOTE — H&P (Signed)
Brooke Dickerson is a 39 y.o. female presenting for IOL. G3P1 at 39.3 AMA GDM- Glyburide Hx of PP PreE last pregnancy with readmission to ICU, no complications this pregnancy  OB History    Gravida  3   Para  1   Term  1   Preterm      AB  1   Living  1     SAB      TAB  1   Ectopic      Multiple  0   Live Births  1          Past Medical History:  Diagnosis Date  . Fibroid   . Gestational diabetes   . Preeclampsia in postpartum period    Past Surgical History:  Procedure Laterality Date  . MOUTH SURGERY     Family History: family history includes Cancer in her father; Heart failure in her mother; Hypertension in her mother. Social History:  reports that she has never smoked. She has never used smokeless tobacco. She reports that she does not drink alcohol and does not use drugs.     Maternal Diabetes: Yes:  Diabetes Type:  Insulin/Medication controlled Genetic Screening: Normal Maternal Ultrasounds/Referrals: Normal Fetal Ultrasounds or other Referrals:  Referred to Materal Fetal Medicine  Maternal Substance Abuse: No Significant Maternal Medications:  None Significant Maternal Lab Results:  Group B Strep negative and Rh negative Other Comments:  None  Review of Systems  Constitutional: Negative.   HENT: Negative.   Eyes: Negative.   Respiratory: Negative.   Cardiovascular: Negative.   Gastrointestinal: Negative.   Endocrine: Negative.   Genitourinary: Negative.   Musculoskeletal: Negative.   Skin: Negative.   Allergic/Immunologic: Negative.   Neurological: Negative.   Hematological: Negative.   Psychiatric/Behavioral: Negative.    History   Last menstrual period 03/04/2019, currently breastfeeding. Maternal Exam:  Introitus: Normal vulva.   Physical Exam Vitals reviewed. Exam conducted with a chaperone present.  Constitutional:      Appearance: Normal appearance.  HENT:     Head: Normocephalic and atraumatic.     Nose: Nose normal.      Mouth/Throat:     Mouth: Mucous membranes are moist.  Eyes:     Extraocular Movements: Extraocular movements intact.     Conjunctiva/sclera: Conjunctivae normal.     Pupils: Pupils are equal, round, and reactive to light.  Cardiovascular:     Rate and Rhythm: Normal rate and regular rhythm.     Pulses: Normal pulses.     Heart sounds: Normal heart sounds.  Pulmonary:     Effort: Pulmonary effort is normal.     Breath sounds: Normal breath sounds.  Abdominal:     Palpations: Abdomen is soft.  Genitourinary:    General: Normal vulva.  Musculoskeletal:        General: Normal range of motion.     Cervical back: Normal range of motion and neck supple.  Skin:    General: Skin is warm and dry.  Neurological:     General: No focal deficit present.     Mental Status: She is alert and oriented to person, place, and time. Mental status is at baseline.  Psychiatric:        Mood and Affect: Mood normal.        Behavior: Behavior normal.        Thought Content: Thought content normal.        Judgment: Judgment normal.    No evidence of HSV lesions  Bedside US confirms vertex SVE 3-4/50/-2 IBOW  Cat I FHR tracing  Prenatal labs: ABO, Rh:  AB neg Antibody:  neg Rubella: Immune (03/25 0000) RPR: Nonreactive (03/25 0000)  HBsAg: Negative (03/25 0000)  HIV: Non-reactive (03/25 0000)  GBS: Negative/-- (09/20 0000)   Assessment/Plan: Admit for IOL Routine GDM order sets, BS q 4 hours Begin pitocin per protocol Routine LDR orders Epidural when desires Brooke Dickerson 12/05/2019, 6:09 AM

## 2019-12-05 NOTE — Progress Notes (Signed)
Pt up to bathroom with steady

## 2019-12-05 NOTE — Progress Notes (Signed)
Labor Progress Note  Brooke Dickerson is a 39 y.o. female presenting for IOL. G3P1 at 39.3, AMA, GDM- Glyburide Hx of PP PreE last pregnancy with readmission to ICU, no complications this pregnancy  Subjective: Pt comfortable post epidural placement, supportive partner at bedside. Pt progressing in active labor now, denies any questions.  Patient Active Problem List   Diagnosis Date Noted  . Gestational diabetes 12/05/2019  . Gestational diabetes mellitus (GDM), antepartum 10/07/2019  . Hypertension 09/03/2014  . Nausea and vomiting 09/03/2014  . Hypertension complicating pregnancy, delivered-current hospitalization 09/03/2014  . Vaginal delivery 08/21/2014  . 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 Rh negative state in antepartum period 08/20/2014  . Vaginal bleeding 08/20/2014  . VULVAR ABSCESS 07/08/2009   Objective: BP 121/68   Pulse 68   Temp 97.6 F (36.4 C) (Oral)   Resp 20   Ht 5' 2.5" (1.588 m)   Wt 76.7 kg   LMP 03/04/2019   SpO2 100%   BMI 30.42 kg/m  No intake/output data recorded. Total I/O In: -  Out: 175 [Urine:175] NST: FHR baseline 120 bpm, Variability: moderate, Accelerations:present, Decelerations:  Absent= Cat 1/Reactive CTX:  irregular, every 2-4 minutes, 50-60 seconds Uterus gravid, soft non tender, moderate to palpate with contractions.  SVE:  Dilation: 6 Effacement (%): 80 Station: 0 Exam by:: J Lisl S[place] Pitocin at 12 mUn/min Bedside US verified vertex.  AROM, large amount, tolerated well, moderate meconium noted.   Assessment:  Brooke Dickerson is a 39 y.o. female presenting for IOL. G3P1 at 39.3, AMA, GDM- Glyburide Hx of PP PreE last pregnancy with readmission to ICU, no complications this pregnancy. Currently progressing in latent labor. Making cervical change in active labor now  Patient Active Problem List   Diagnosis Date Noted  . Gestational diabetes 12/05/2019  . Gestational diabetes mellitus (GDM), antepartum  10/07/2019  . Hypertension 09/03/2014  . Nausea and vomiting 09/03/2014  . Hypertension complicating pregnancy, delivered-current hospitalization 09/03/2014  . Vaginal delivery 08/21/2014  . 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 Rh negative state in antepartum period 08/20/2014  . Vaginal bleeding 08/20/2014  . VULVAR ABSCESS 07/08/2009   NICHD: Category 1  Membranes:  AROM moderate meconium, 10/16 @ 1200x 4hrs, no s/s of infection  Induction:    Pitocin - 12  Pain management:               IV pain management: xPRN             Epidural placement: Placed on 12/05/2019 @ 1308  GBS Negative  A1GDM: stable. CBG (last 3)  Recent Labs    12/05/19 0747 12/05/19 1008 12/05/19 1353  GLUCAP 77 62* 71    Plan: Continue labor plan Continuous monitoring Rest Ambulate Frequent position changes to facilitate fetal rotation and descent. Will reassess with cervical exam at 1900 or earlier if necessary Continue pitocin per protocol 2x2 Anticipate labor progression and vaginal delivery.    Noralyn Pick, NP-C, CNM, MSN 12/05/2019. 3:43 PM

## 2019-12-05 NOTE — Progress Notes (Signed)
Labor Progress Note  Brooke Dickerson is a 39 y.o. female presenting for IOL. G3P1 at 39.3, AMA, GDM- Glyburide Hx of PP PreE last pregnancy with readmission to ICU, no complications this pregnancy  Subjective: Pt feeling mild cxt, irregular, desires epidural as soon as she feels them more intense. Pt stable. Reviewed POC on AROM and pitocin and R/B/A. Pt verbilized consent. Pt has a pendulous uterus, used a sheet to help bind her up and keep baby in the pelvis.  Patient Active Problem List   Diagnosis Date Noted  . Gestational diabetes 12/05/2019  . Gestational diabetes mellitus (GDM), antepartum 10/07/2019  . Hypertension 09/03/2014  . Nausea and vomiting 09/03/2014  . Hypertension complicating pregnancy, delivered-current hospitalization 09/03/2014  . Vaginal delivery 08/21/2014  . 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 Rh negative state in antepartum period 08/20/2014  . Vaginal bleeding 08/20/2014  . VULVAR ABSCESS 07/08/2009   Objective: BP (!) 152/81   Pulse 91   Temp 98.6 F (37 C) (Oral)   Resp 18   Ht 5' 2.5" (1.588 m)   Wt 76.7 kg   LMP 03/04/2019   BMI 30.42 kg/m  No intake/output data recorded. No intake/output data recorded. NST: FHR baseline 135 bpm, Variability: moderate, Accelerations:present, Decelerations:  Absent= Cat 1/Reactive CTX:  irregular, every 4-8 minutes, 50-60 seconds Uterus gravid, soft non tender, moderate to palpate with contractions.  SVE:  Dilation: 4 Effacement (%): 50 Station: Ballotable Exam by:: [employer] Pitocin at 40mUn/min Bedside US verified vertex.  AROM, large amount, tolerated well, moderate meconium noted.   Assessment:  Brooke Dickerson is a 39 y.o. female presenting for IOL. G3P1 at 39.3, AMA, GDM- Glyburide Hx of PP PreE last pregnancy with readmission to ICU, no complications this pregnancy. Currently progressing in latent labor. Making cervical change in pitocin and AROM   Patient Active Problem List    Diagnosis Date Noted  . Gestational diabetes 12/05/2019  . Gestational diabetes mellitus (GDM), antepartum 10/07/2019  . Hypertension 09/03/2014  . Nausea and vomiting 09/03/2014  . Hypertension complicating pregnancy, delivered-current hospitalization 09/03/2014  . Vaginal delivery 08/21/2014  . 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 Rh negative state in antepartum period 08/20/2014  . Vaginal bleeding 08/20/2014  . VULVAR ABSCESS 07/08/2009   NICHD: Category 1  Membranes:  AROM moderate meconium, 10/16 @ 1200x 0hrs, no s/s of infection  Induction:    Pitocin - 8  Pain management:               IV pain management: xPRN             Epidural placement: PRN  GBS Negative  A1GDM: stable. CBG (last 3)  Recent Labs    12/05/19 0747 12/05/19 1008  GLUCAP 77 62*    Plan: Continue labor plan Continuous monitoring Rest Ambulate Frequent position changes to facilitate fetal rotation and descent. Will reassess with cervical exam at 1600 or earlier if necessary Continue pitocin per protocol 2x2 Anticipate labor progression and vaginal delivery.    Noralyn Pick, NP-C, CNM, MSN 12/05/2019. 12:37 PM

## 2019-12-05 NOTE — Plan of Care (Signed)
PT DEMONSTRATED UNDERSTANDING

## 2019-12-05 NOTE — Anesthesia Preprocedure Evaluation (Addendum)
Anesthesia Evaluation  Patient identified by MRN, date of birth, ID band Patient awake    Reviewed: Allergy & Precautions, NPO status , Patient's Chart, lab work & pertinent test results  Airway Mallampati: II  TM Distance: >3 FB Neck ROM: Full    Dental no notable dental hx.    Pulmonary neg pulmonary ROS,    Pulmonary exam normal breath sounds clear to auscultation       Cardiovascular Exercise Tolerance: Good hypertension, Normal cardiovascular exam Rhythm:Regular Rate:Normal     Neuro/Psych negative neurological ROS  negative psych ROS   GI/Hepatic negative GI ROS, Neg liver ROS,   Endo/Other  diabetes, Gestational, Oral Hypoglycemic Agents  Renal/GU negative Renal ROS  negative genitourinary   Musculoskeletal negative musculoskeletal ROS (+)   Abdominal   Peds negative pediatric ROS (+)  Hematology negative hematology ROS (+)   Anesthesia Other Findings   Reproductive/Obstetrics (+) Pregnancy                            Anesthesia Physical Anesthesia Plan  ASA: III  Anesthesia Plan: Epidural   Post-op Pain Management:    Induction:   PONV Risk Score and Plan: 2  Airway Management Planned: Natural Airway  Additional Equipment:   Intra-op Plan:   Post-operative Plan:   Informed Consent: I have reviewed the patients History and Physical, chart, labs and discussed the procedure including the risks, benefits and alternatives for the proposed anesthesia with the patient or authorized representative who has indicated his/her understanding and acceptance.       Plan Discussed with: Anesthesiologist  Anesthesia Plan Comments:         Anesthesia Quick Evaluation

## 2019-12-06 ENCOUNTER — Encounter (HOSPITAL_COMMUNITY): Payer: Self-pay | Admitting: Obstetrics and Gynecology

## 2019-12-06 LAB — CBC
HCT: 35.2 % — ABNORMAL LOW (ref 36.0–46.0)
Hemoglobin: 11.7 g/dL — ABNORMAL LOW (ref 12.0–15.0)
MCH: 30.4 pg (ref 26.0–34.0)
MCHC: 33.2 g/dL (ref 30.0–36.0)
MCV: 91.4 fL (ref 80.0–100.0)
Platelets: 280 10*3/uL (ref 150–400)
RBC: 3.85 MIL/uL — ABNORMAL LOW (ref 3.87–5.11)
RDW: 15.2 % (ref 11.5–15.5)
WBC: 10.3 10*3/uL (ref 4.0–10.5)
nRBC: 0 % (ref 0.0–0.2)

## 2019-12-06 LAB — GLUCOSE, CAPILLARY
Glucose-Capillary: 89 mg/dL (ref 70–99)
Glucose-Capillary: 138 mg/dL — ABNORMAL HIGH (ref 70–99)
Glucose-Capillary: 126 mg/dL — ABNORMAL HIGH (ref 70–99)

## 2019-12-06 MED ORDER — RHO D IMMUNE GLOBULIN 1500 UNIT/2ML IJ SOSY
300.0000 ug | PREFILLED_SYRINGE | Freq: Once | INTRAMUSCULAR | Status: AC
  Administered 2019-12-06: 300 ug via INTRAVENOUS
  Filled 2019-12-06: qty 2

## 2019-12-06 NOTE — Progress Notes (Addendum)
PPD# 1 SVD w/  Information for the patient's newborn:  Chairty, Toman [[id number]]  female    Baby Name Brooke Dickerson Circumcision plans out-patient   S:   Reports feeling good Tolerating PO fluid and solids No nausea or vomiting Bleeding is light, no clots Pain controlled with acetaminophen and ibuprofen (OTC) Up ad lib / ambulatory / voiding w/o difficulty Feeding: Breast    O:   VS: BP 123/86 (BP Location: Left Arm)   Pulse 83   Temp 98.6 F (37 C) (Oral)   Resp 17   Ht 5' 2.5" (1.588 m)   Wt 76.7 kg   LMP 03/04/2019   SpO2 100%   Breastfeeding Unknown   BMI 30.42 kg/m   LABS:  Recent Labs    12/05/19 0710 12/06/19 0545  WBC 8.0 10.3  HGB 11.4* 11.7*  PLT 331 280   Results for SERIN, THORNELL (MRN [id number]) as of 12/06/2019 15:24  Ref. Range 12/05/2019 07:10 12/05/2019 07:47 12/05/2019 10:08 12/05/2019 13:53 12/06/2019 05:45 12/06/2019 06:40 12/06/2019 11:49  Glucose-Capillary Latest Ref Range: 70 - 99 mg/dL  77 62 (L) 71  89 138 (H)   Blood type: --/--/AB NEG (10/17 0545) Rubella: Immune (03/25 0000)                      I&O: Intake/Output      10/16 0701 - 10/17 0700 10/17 0701 - 10/18 0700   Urine (mL/kg/hr) 425 (0.2)    Blood 157    Total Output 582    Net -582           Physical Exam: Alert and oriented X3 Lungs: Clear and unlabored Heart: regular rate and rhythm / no mumurs Abdomen: soft, non-tender, non-distended  Fundus: firm, non-tender Perineum: intact Lochia: appropriate Extremities: no edema, no calf pain or tenderness    A:  PPD # 1  Normal exam GDM    -controlled w/ Glyburide 5mg  daily HS  P:  Routine post partum orders Fasting CBG and plan to DC CBG after 12/07/19 Anticipate D/C on 12/07/19  Plan reviewed w/ Dr. Jethro Bastos, MSN, CNM 12/06/2019, 3:23 PM

## 2019-12-06 NOTE — Anesthesia Postprocedure Evaluation (Signed)
Anesthesia Post Note  Patient: Brooke Dickerson  Procedure(s) Performed: AN AD HOC LABOR EPIDURAL     Patient location during evaluation: Mother Baby Anesthesia Type: Epidural Level of consciousness: awake Pain management: satisfactory to patient Vital Signs Assessment: post-procedure vital signs reviewed and stable Respiratory status: spontaneous breathing Cardiovascular status: stable Anesthetic complications: no   No complications documented.  Last Vitals:  Vitals:   12/05/19 2312 12/06/19 0624  BP: 125/83 124/80  Pulse: 75 94  Resp: 18 18  Temp: 36.4 C 36.7 C  SpO2:      Last Pain:  Vitals:   12/06/19 0624  TempSrc: Oral  PainSc: 0-No pain   Pain Goal:                   [employer]

## 2019-12-06 NOTE — Lactation Note (Signed)
This note was copied from a baby's chart. Lactation Consultation Note  Patient Name: Brooke Dickerson [id number] Date: 12/06/2019 Reason for consult: Initial assessment;Term  Initial visit with 45 hours old infant with 3.08% of weight loss of a P2 mother with breastfeeding experience. Mother states infant just breastfed. Mother used a 5"french tube and syringe with formula. Infant able to take 20 mL. Infant has 1 stool in 24 h.  Mother has personal DEBP set up and collected ~2-3 mL. Praised mother for milk supply and effort. Encouraged using to stimulate her breasts after each supplementation.   Encouraged to follow babies' hunger and fullness cues. Reviewed importance to offer the breast 8 to 12 times in a 24-hour period for proper stimulation and to establish good milk supply. Reviewed breastfeeding basics. Reviewed newborn behavior and expectations with parents during first days of life.  Promoted maternal rest, hydration and food intake. Encouraged to contact [facility] for support when ready to breastfeed baby and recommended to request help for questions or concerns. Provided [place] services brochure.    All questions answered at this time.    Maternal Data Formula Feeding for Exclusion: No Does the patient have breastfeeding experience prior to this delivery?: Yes  Feeding Feeding Type: Breast Milk with Formula added  LATCH Score Latch: Grasps breast easily, tongue down, lips flanged, rhythmical sucking.  Audible Swallowing: A few with stimulation  Type of Nipple: Everted at rest and after stimulation  Comfort (Breast/Nipple): Soft / non-tender  Hold (Positioning): Assistance needed to correctly position infant at breast and maintain latch.  LATCH Score: 8  Interventions Interventions: Breast feeding basics reviewed  Lactation Tools Discussed/Used Tools: 41F feeding tube / Syringe WIC Program: No   Consult Status Consult Status: Follow-up Date: 12/07/19 Follow-up type:  In-patient    [name] A Higuera [name] 12/06/2019, 5:55 PM

## 2019-12-07 LAB — RH IG WORKUP (INCLUDES ABO/RH)
ABO/RH(D): AB NEG
Fetal Screen: NEGATIVE
Gestational Age(Wks): 39
Unit division: 0

## 2019-12-07 NOTE — Lactation Note (Signed)
This note was copied from a baby's chart. Lactation Consultation Note  Patient Name: Brooke Dickerson [id number] Date: 12/07/2019 Reason for consult: Follow-up assessment  P2 mother whose infant is now 66 hours old.  This is a term baby at 39+3 weeks.  Mother mostly pumped and bottle fed with her first child.  She feels like this baby is latching and feeding better and would like to breast feed this baby.  Baby was asleep in father's arms when I arrived.  Mother had a few basic breast feeding questions which we reviewed.  She will continue to feed on cue (reviewed cues with mother).  Mother's goal will be to obtain 8-12 feedings in a 24 hour period.  Mother is concerned that she cannot tell if he is "getting enough."  Educated her on how to know when baby is content and full.  He is voiding/stooling well.  Mother reassured of her breast feeding ability after our discussion.  Praised her for her efforts and encouraged her to call the OP phone number for any concerns after discharge.  Mother appreciative.  Engorgement prevention/treatment discussed.  Mother has a manual pump and a DEBP for home use.  No support person present at this time.  She has her own nipple ointment for comfort.   Maternal Data    Feeding Feeding Type: Other (comment) (sns with breast milk)  LATCH Score Latch: Repeated attempts needed to sustain latch, nipple held in mouth throughout feeding, stimulation needed to elicit sucking reflex. (did not see latch asked mom questions about latch )  Audible Swallowing: A few with stimulation  Type of Nipple: Everted at rest and after stimulation  Comfort (Breast/Nipple): Soft / non-tender  Hold (Positioning): No assistance needed to correctly position infant at breast.  LATCH Score: 8  Interventions    Lactation Tools Discussed/Used     Consult Status Consult Status: Complete Date: 12/07/19 Follow-up type: Call as needed    Kelsye Loomer R Kaydance Bowie 12/07/2019, 9:15  AM

## 2019-12-07 NOTE — Discharge Summary (Signed)
OB Discharge Summary     Patient Name: Brooke Dickerson DOB: March 17, 1980 MRN: [id number]  Date of admission: 12/05/2019 Delivering MD: Noralyn Pick   Date of discharge: 12/07/2019  Admitting diagnosis: Gestational diabetes [O24.419] AMA Intrauterine pregnancy: [redacted]w[redacted]d     Secondary diagnosis:  Active Problems:   Gestational diabetes  Additional problems: None     Discharge diagnosis: Term Pregnancy Delivered and GDM A2                                                                                                Post partum procedures:None  Augmentation: AROM and Pitocin  Complications: None  Hospital course:  Induction of Labor With Vaginal Delivery   39 y.o. yo [id number] at [redacted]w[redacted]d was admitted to the hospital 12/05/2019 for induction of labor.  Indication for induction: A2 DM and AMA.  Patient had an uncomplicated labor course as follows: Membrane Rupture Time/Date: 11:52 AM ,12/05/2019   Delivery Method:Vaginal, Spontaneous  Episiotomy: None  Lacerations:  None  Details of delivery can be found in separate delivery note.  Patient had a routine postpartum course. Patient is discharged home 12/07/19.  Newborn Data: Birth date:12/05/2019  Birth time:4:57 PM  Gender:Female  Living status:Living  Apgars:9 ,9  Weight:3379 g   Physical exam  Vitals:   12/06/19 0624 12/06/19 1258 12/06/19 2200 12/07/19 0520  BP: 124/80 123/86 128/82 127/85  Pulse: 94 83 79 82  Resp: 18 17 18 18   Temp: 98 F (36.7 C) 98.6 F (37 C) 97.8 F (36.6 C) 98.4 F (36.9 C)  TempSrc: Oral Oral Oral Oral  SpO2:  100%  100%  Weight:      Height:       General: alert, cooperative and no distress Lochia: appropriate Uterine Fundus: firm Incision: N/A DVT Evaluation: No evidence of DVT seen on physical exam. Labs: Lab Results  Component Value Date   WBC 10.3 12/06/2019   HGB 11.7 (L) 12/06/2019   HCT 35.2 (L) 12/06/2019   MCV 91.4 12/06/2019   PLT 280 12/06/2019   CMP Latest Ref Rng &  Units 09/04/2014  Glucose 65 - 99 mg/dL 114(H)  BUN 6 - 20 mg/dL 7  Creatinine 0.44 - 1.00 mg/dL 0.70  Sodium 135 - 145 mmol/L 138  Potassium 3.5 - 5.1 mmol/L 3.2(L)  Chloride 101 - 111 mmol/L 104  CO2 22 - 32 mmol/L 27  Calcium 8.9 - 10.3 mg/dL 7.2(L)  Total Protein 6.5 - 8.1 g/dL 6.2(L)  Total Bilirubin 0.3 - 1.2 mg/dL 1.1  Alkaline Phos 38 - 126 U/L 132(H)  AST 15 - 41 U/L 12(L)  ALT 14 - 54 U/L 13(L)    Discharge instruction: per After Visit Summary and "Baby and Me Booklet".  After visit meds:  Allergies as of 12/07/2019   No Known Allergies     Medication List    STOP taking these medications   aspirin EC 81 MG tablet   glyBURIDE 5 MG tablet Commonly known as: DIABETA   LORazepam 1 MG tablet Commonly known as: Ativan   valACYclovir 500 MG tablet Commonly known as: VALTREX  TAKE these medications   ibuprofen 600 MG tablet Commonly known as: ADVIL Take 1 tablet (600 mg total) by mouth every 6 (six) hours.   PRENATAL VITAMIN PO Take 1 tablet by mouth daily.       Diet: routine diet  Activity: Advance as tolerated. Pelvic rest for 6 weeks.   Outpatient follow up:3 week Follow up Appt:No future appointments. Follow up Visit:No follow-ups on file.  Postpartum contraception: Tubal Ligation  Newborn Data: Live born female  Birth Weight: 7 lb 7.2 oz (3379 g) APGAR: 61, 9  Newborn Delivery   Birth date/time: 12/05/2019 16:57:00 Delivery type: Vaginal, Spontaneous      Baby Feeding: Breast Disposition:home with mother   12/07/2019 Starla Link, CNM

## 2019-12-07 NOTE — Discharge Instructions (Signed)

## 2021-06-21 IMAGING — US US MFM FETAL BPP W/O NON-STRESS
1 series · 12 of 28 positions shown · non-contrast
Comparison: none

[Series 1: us mfm fetal bpp w/o non-stress · 32 acquisitions, 12 frames shown]
[im 2/32]
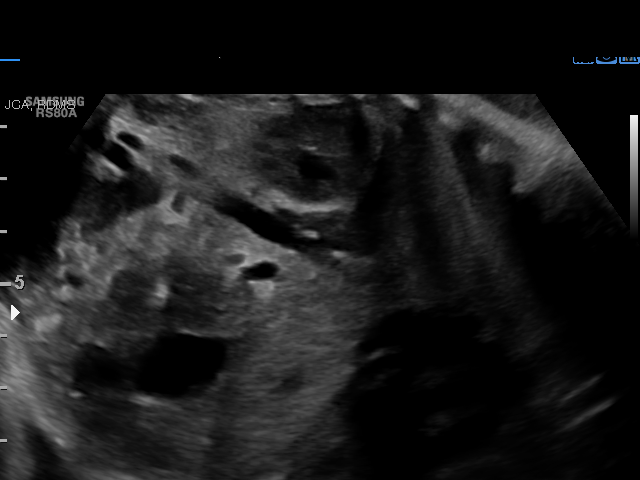
[im 4/32]
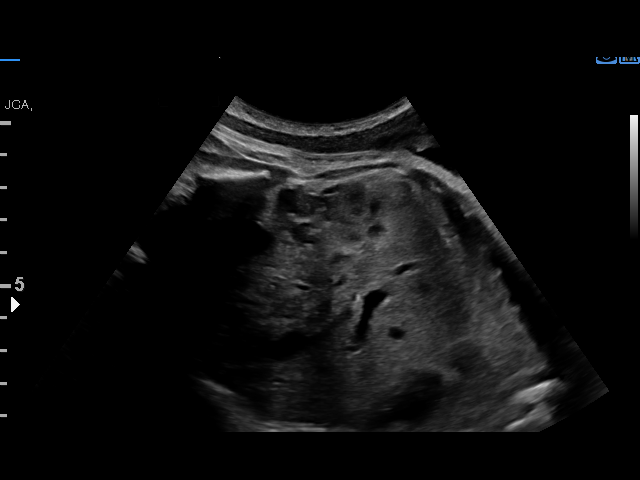
[im 6/32]
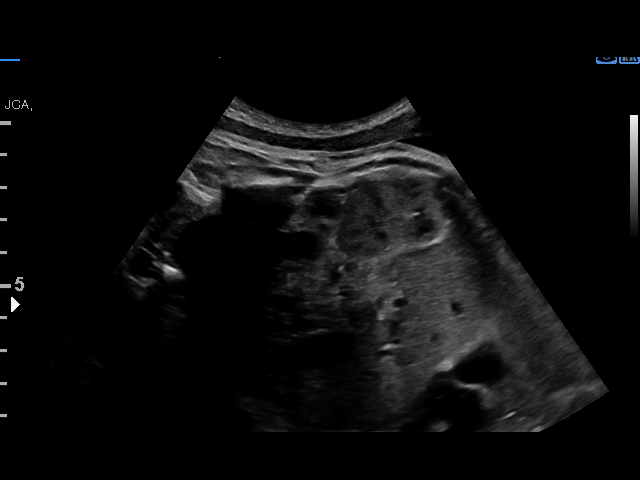
[im 10/32]
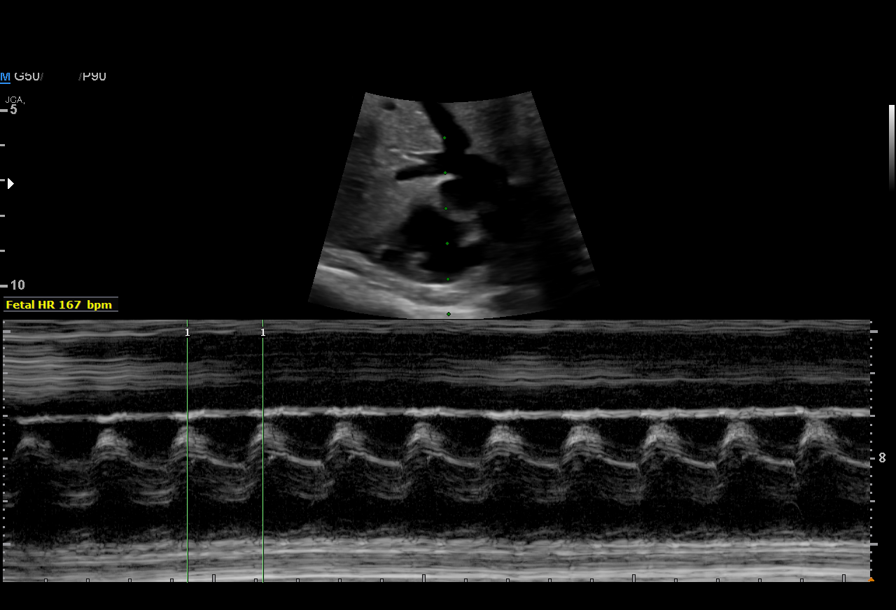
[im 12/32]
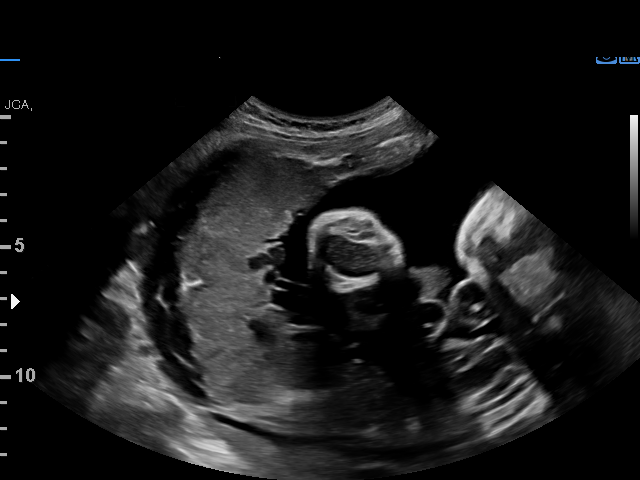
[im 14/32]
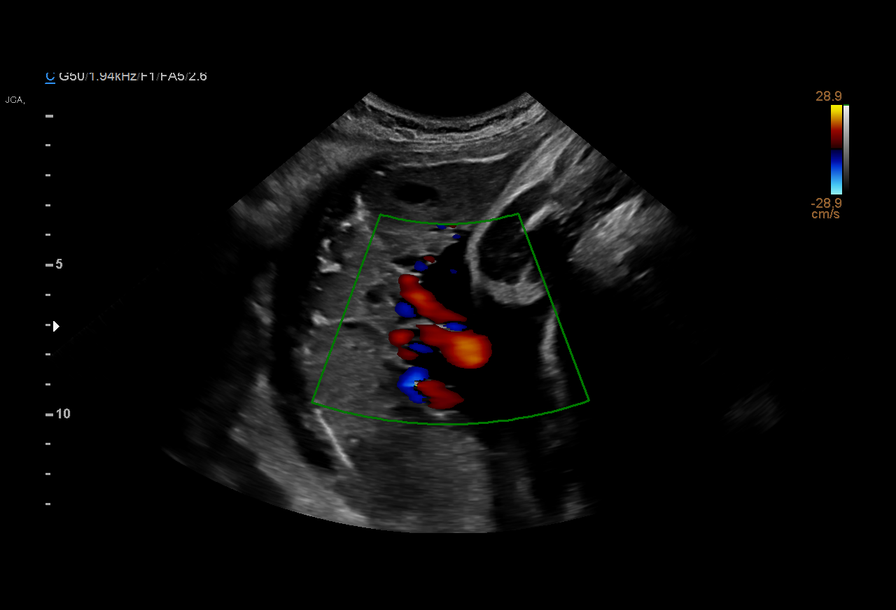
[im 18/32]
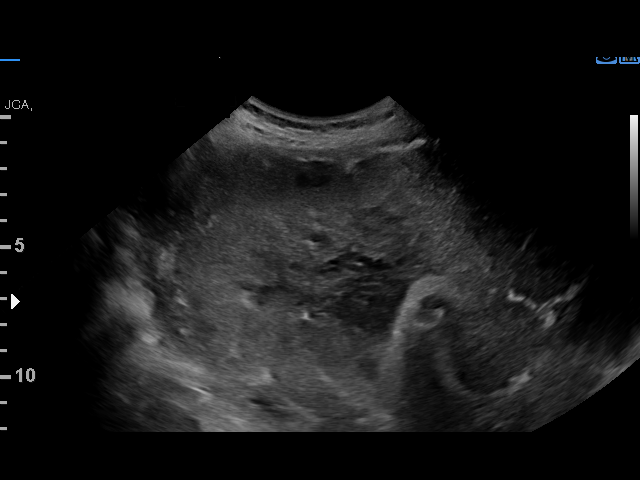
[im 20/32]
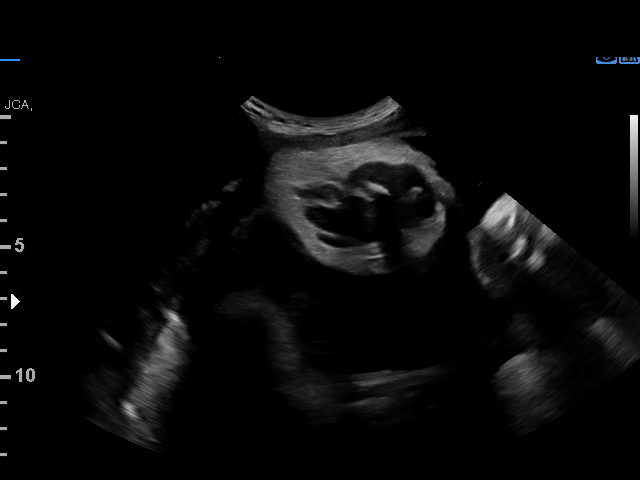
[im 22/32]
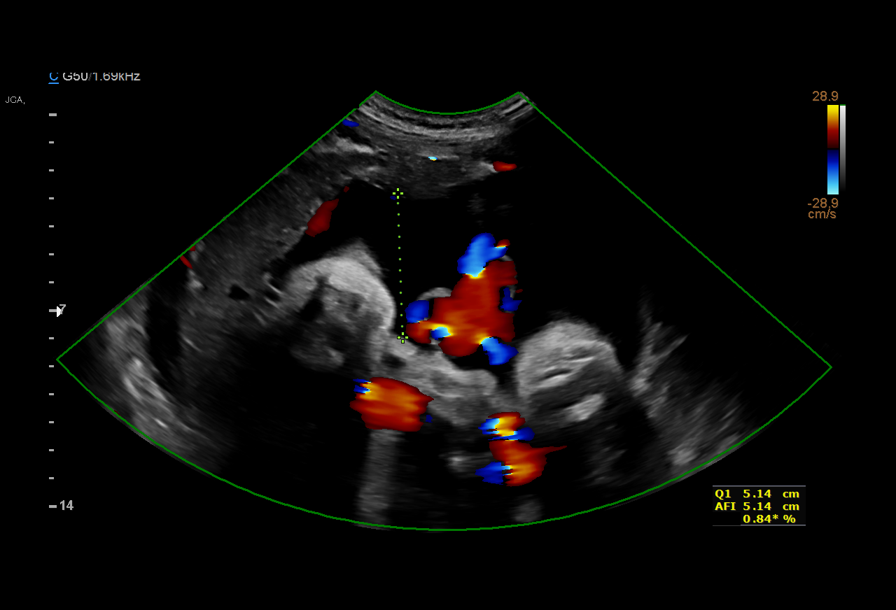
[im 26/32]
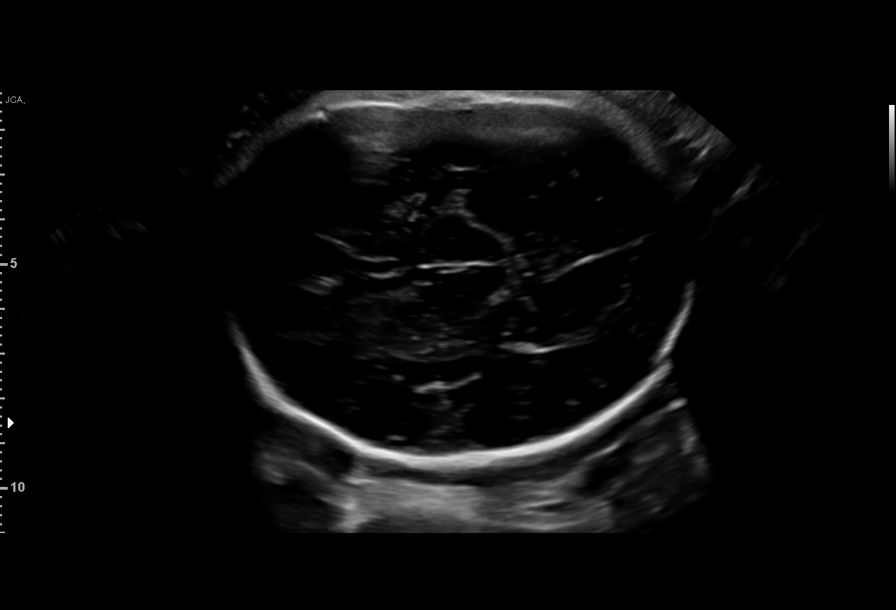
[im 28/32]
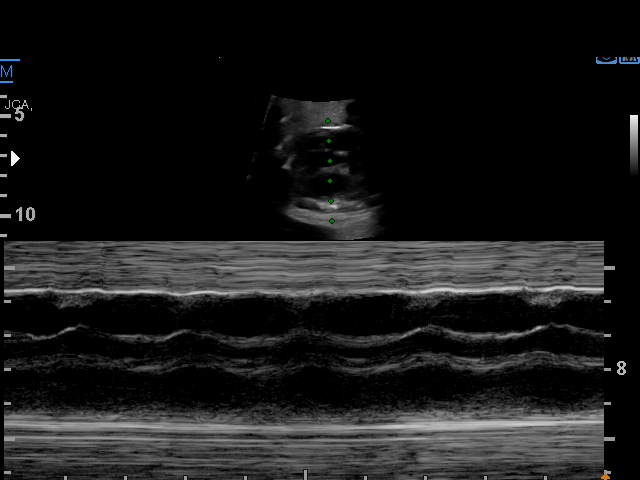
[im 30/32]
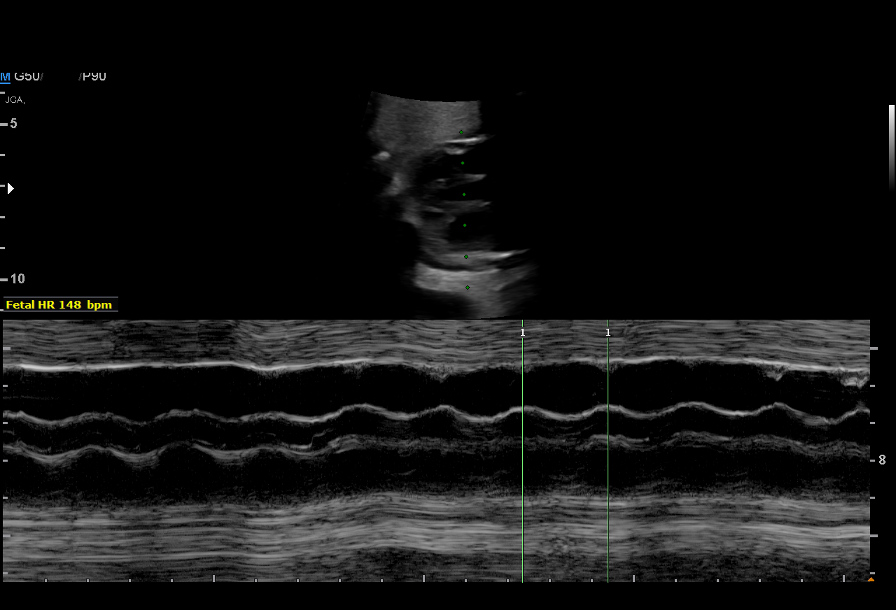

[12 of 28 positions shown; findings below may reference images not displayed]

Obstetrics &
                                                            Gynecology
                                                            [address]
                                                            [place].
                   CNM

Indications

 35 weeks gestation of pregnancy
 Gestational diabetes in pregnancy,
 controlled by oral hypoglycemic drugs
 Uterine fibroids affecting pregnancy in third  O34.13,
 trimester, antepartum
 Fetal abnormality - other known or
 suspected (Bilateral polydactyly)
 Advanced maternal age multigravida 35+,
 third trimester
 Poor obstetric history: Previous
 preeclampsia / eclampsia/gestational HTN
 No genetic testing in chart
Fetal Evaluation

 Num Of Fetuses:         1
 Fetal Heart Rate(bpm):  171
 Cardiac Activity:       Observed
 Presentation:           Cephalic
 Placenta:               Posterior Fundal
 P. Cord Insertion:      Visualized
 Amniotic Fluid
 AFI FV:      Within normal limits

 AFI Sum(cm)     %Tile       Largest Pocket(cm)
 19.54           73

 RUQ(cm)       RLQ(cm)       LUQ(cm)        LLQ(cm)

Biophysical Evaluation

 Amniotic F.V:   Within normal limits       F. Tone:        Observed
 F. Movement:    Observed                   Score:          [DATE]
 F. Breathing:   Observed
Biometry

 LV:        3.1  mm
OB History

 Gravidity:    3         Term:   1        Prem:   0        SAB:   0
 TOP:          1       Ectopic:  0        Living: 1
Gestational Age

 LMP:           35w 0d        Date:  03/04/19                 EDD:   12/09/19
 Best:          35w 0d     Det. By:  LMP  (03/04/19)          EDD:   12/09/19
Impression

 Antenatal testing performed given maternal OXYIF
 The biophysical profile was [DATE] with good fetal movement and
 amniotic fluid volume.
Recommendations

 Continue weekly testing
 Follow up growth in 1-2 weeks.
 Consider delivery between 37-39 weeks.

## 2022-06-07 ENCOUNTER — Other Ambulatory Visit: Payer: Self-pay | Admitting: Obstetrics and Gynecology

## 2022-06-07 ENCOUNTER — Other Ambulatory Visit: Payer: Self-pay

## 2022-06-07 DIAGNOSIS — N6320 Unspecified lump in the left breast, unspecified quadrant: Secondary | ICD-10-CM

## 2022-07-10 ENCOUNTER — Other Ambulatory Visit: Payer: Self-pay | Admitting: Obstetrics and Gynecology
